# Patient Record
Sex: Male | Born: 1960 | Race: Black or African American | Hispanic: No | Marital: Married | Smoking: Never smoker
Health system: Southern US, Community
[De-identification: ages and names within clinical notes are randomized; demographics above are authoritative.]

## PROBLEM LIST (undated history)

## (undated) DIAGNOSIS — F419 Anxiety disorder, unspecified: Secondary | ICD-10-CM

## (undated) DIAGNOSIS — Z95810 Presence of automatic (implantable) cardiac defibrillator: Secondary | ICD-10-CM

## (undated) DIAGNOSIS — I2510 Atherosclerotic heart disease of native coronary artery without angina pectoris: Secondary | ICD-10-CM

## (undated) DIAGNOSIS — I509 Heart failure, unspecified: Secondary | ICD-10-CM

## (undated) DIAGNOSIS — G4730 Sleep apnea, unspecified: Secondary | ICD-10-CM

## (undated) DIAGNOSIS — E039 Hypothyroidism, unspecified: Secondary | ICD-10-CM

## (undated) HISTORY — PX: CARDIAC CATHETERIZATION: SHX172

## (undated) HISTORY — PX: GASTRIC BYPASS: SHX52

---

## 2020-05-07 ENCOUNTER — Emergency Department (HOSPITAL_COMMUNITY): Payer: 59

## 2020-05-07 ENCOUNTER — Inpatient Hospital Stay (HOSPITAL_COMMUNITY)
Admission: EM | Admit: 2020-05-07 | Discharge: 2020-05-11 | DRG: 193 | Disposition: A | Discharge status: Home / Self Care | Payer: 59 | Attending: Family Medicine | Admitting: Family Medicine

## 2020-05-07 ENCOUNTER — Other Ambulatory Visit: Payer: Self-pay

## 2020-05-07 ENCOUNTER — Encounter (HOSPITAL_COMMUNITY): Payer: Self-pay | Admitting: Emergency Medicine

## 2020-05-07 DIAGNOSIS — I5043 Acute on chronic combined systolic (congestive) and diastolic (congestive) heart failure: Secondary | ICD-10-CM | POA: Diagnosis present

## 2020-05-07 DIAGNOSIS — Z77120 Contact with and (suspected) exposure to mold (toxic): Secondary | ICD-10-CM | POA: Diagnosis not present

## 2020-05-07 DIAGNOSIS — J189 Pneumonia, unspecified organism: Secondary | ICD-10-CM | POA: Diagnosis present

## 2020-05-07 DIAGNOSIS — I5031 Acute diastolic (congestive) heart failure: Secondary | ICD-10-CM | POA: Diagnosis not present

## 2020-05-07 DIAGNOSIS — Z9884 Bariatric surgery status: Secondary | ICD-10-CM | POA: Diagnosis not present

## 2020-05-07 DIAGNOSIS — I5042 Chronic combined systolic (congestive) and diastolic (congestive) heart failure: Secondary | ICD-10-CM | POA: Diagnosis not present

## 2020-05-07 DIAGNOSIS — Z9989 Dependence on other enabling machines and devices: Secondary | ICD-10-CM

## 2020-05-07 DIAGNOSIS — E785 Hyperlipidemia, unspecified: Secondary | ICD-10-CM | POA: Diagnosis present

## 2020-05-07 DIAGNOSIS — I272 Pulmonary hypertension, unspecified: Secondary | ICD-10-CM | POA: Diagnosis present

## 2020-05-07 DIAGNOSIS — I493 Ventricular premature depolarization: Secondary | ICD-10-CM | POA: Diagnosis present

## 2020-05-07 DIAGNOSIS — Z6837 Body mass index (BMI) 37.0-37.9, adult: Secondary | ICD-10-CM

## 2020-05-07 DIAGNOSIS — Z9861 Coronary angioplasty status: Secondary | ICD-10-CM

## 2020-05-07 DIAGNOSIS — J180 Bronchopneumonia, unspecified organism: Secondary | ICD-10-CM | POA: Diagnosis present

## 2020-05-07 DIAGNOSIS — Z20822 Contact with and (suspected) exposure to covid-19: Secondary | ICD-10-CM | POA: Diagnosis present

## 2020-05-07 DIAGNOSIS — E039 Hypothyroidism, unspecified: Secondary | ICD-10-CM | POA: Diagnosis present

## 2020-05-07 DIAGNOSIS — G894 Chronic pain syndrome: Secondary | ICD-10-CM | POA: Diagnosis present

## 2020-05-07 DIAGNOSIS — R071 Chest pain on breathing: Secondary | ICD-10-CM

## 2020-05-07 DIAGNOSIS — I340 Nonrheumatic mitral (valve) insufficiency: Secondary | ICD-10-CM | POA: Diagnosis not present

## 2020-05-07 DIAGNOSIS — F419 Anxiety disorder, unspecified: Secondary | ICD-10-CM | POA: Diagnosis present

## 2020-05-07 DIAGNOSIS — R9389 Abnormal findings on diagnostic imaging of other specified body structures: Secondary | ICD-10-CM | POA: Diagnosis not present

## 2020-05-07 DIAGNOSIS — G4733 Obstructive sleep apnea (adult) (pediatric): Secondary | ICD-10-CM

## 2020-05-07 DIAGNOSIS — R0600 Dyspnea, unspecified: Secondary | ICD-10-CM

## 2020-05-07 DIAGNOSIS — I2510 Atherosclerotic heart disease of native coronary artery without angina pectoris: Secondary | ICD-10-CM | POA: Diagnosis present

## 2020-05-07 DIAGNOSIS — I255 Ischemic cardiomyopathy: Secondary | ICD-10-CM | POA: Diagnosis present

## 2020-05-07 DIAGNOSIS — R008 Other abnormalities of heart beat: Secondary | ICD-10-CM | POA: Diagnosis present

## 2020-05-07 DIAGNOSIS — Z888 Allergy status to other drugs, medicaments and biological substances status: Secondary | ICD-10-CM | POA: Diagnosis not present

## 2020-05-07 DIAGNOSIS — Z95810 Presence of automatic (implantable) cardiac defibrillator: Secondary | ICD-10-CM | POA: Diagnosis not present

## 2020-05-07 DIAGNOSIS — R079 Chest pain, unspecified: Secondary | ICD-10-CM | POA: Diagnosis present

## 2020-05-07 DIAGNOSIS — R0602 Shortness of breath: Secondary | ICD-10-CM | POA: Diagnosis present

## 2020-05-07 DIAGNOSIS — I5020 Unspecified systolic (congestive) heart failure: Secondary | ICD-10-CM | POA: Diagnosis not present

## 2020-05-07 DIAGNOSIS — E669 Obesity, unspecified: Secondary | ICD-10-CM | POA: Diagnosis present

## 2020-05-07 DIAGNOSIS — I110 Hypertensive heart disease with heart failure: Secondary | ICD-10-CM | POA: Diagnosis present

## 2020-05-07 HISTORY — DX: Sleep apnea, unspecified: G47.30

## 2020-05-07 HISTORY — DX: Hypothyroidism, unspecified: E03.9

## 2020-05-07 HISTORY — DX: Atherosclerotic heart disease of native coronary artery without angina pectoris: I25.10

## 2020-05-07 HISTORY — DX: Anxiety disorder, unspecified: F41.9

## 2020-05-07 HISTORY — DX: Heart failure, unspecified: I50.9

## 2020-05-07 HISTORY — DX: Presence of automatic (implantable) cardiac defibrillator: Z95.810

## 2020-05-07 LAB — CBC WITH DIFFERENTIAL/PLATELET
Abs Immature Granulocytes: 0.06 10*3/uL (ref 0.00–0.07)
Basophils Absolute: 0.1 10*3/uL (ref 0.0–0.1)
Basophils Relative: 1 %
Eosinophils Absolute: 0 10*3/uL (ref 0.0–0.5)
Eosinophils Relative: 0 %
HCT: 36.9 % — ABNORMAL LOW (ref 39.0–52.0)
Hemoglobin: 12.3 g/dL — ABNORMAL LOW (ref 13.0–17.0)
Immature Granulocytes: 1 %
Lymphocytes Relative: 7 %
Lymphs Abs: 0.7 10*3/uL (ref 0.7–4.0)
MCH: 30.4 pg (ref 26.0–34.0)
MCHC: 33.3 g/dL (ref 30.0–36.0)
MCV: 91.3 fL (ref 80.0–100.0)
Monocytes Absolute: 0.9 10*3/uL (ref 0.1–1.0)
Monocytes Relative: 8 %
Neutro Abs: 9.2 10*3/uL — ABNORMAL HIGH (ref 1.7–7.7)
Neutrophils Relative %: 83 %
Platelets: 203 10*3/uL (ref 150–400)
RBC: 4.04 MIL/uL — ABNORMAL LOW (ref 4.22–5.81)
RDW: 14.8 % (ref 11.5–15.5)
WBC: 11 10*3/uL — ABNORMAL HIGH (ref 4.0–10.5)
nRBC: 0 % (ref 0.0–0.2)

## 2020-05-07 LAB — TROPONIN I (HIGH SENSITIVITY)
Troponin I (High Sensitivity): 6 ng/L (ref <18)
Troponin I (High Sensitivity): 6 ng/L (ref <18)

## 2020-05-07 LAB — BRAIN NATRIURETIC PEPTIDE: B Natriuretic Peptide: 1095.8 pg/mL — ABNORMAL HIGH (ref 0.0–100.0)

## 2020-05-07 LAB — I-STAT CHEM 8, ED
BUN: 13 mg/dL (ref 6–20)
Calcium, Ion: 1.18 mmol/L (ref 1.15–1.40)
Chloride: 103 mmol/L (ref 98–111)
Creatinine, Ser: 0.8 mg/dL (ref 0.61–1.24)
Glucose, Bld: 105 mg/dL — ABNORMAL HIGH (ref 70–99)
HCT: 37 % — ABNORMAL LOW (ref 39.0–52.0)
Hemoglobin: 12.6 g/dL — ABNORMAL LOW (ref 13.0–17.0)
Potassium: 3.8 mmol/L (ref 3.5–5.1)
Sodium: 137 mmol/L (ref 135–145)
TCO2: 21 mmol/L — ABNORMAL LOW (ref 22–32)

## 2020-05-07 LAB — RESP PANEL BY RT-PCR (FLU A&B, COVID) ARPGX2
Influenza A by PCR: NEGATIVE
Influenza B by PCR: NEGATIVE
SARS Coronavirus 2 by RT PCR: NEGATIVE

## 2020-05-07 LAB — STREP PNEUMONIAE URINARY ANTIGEN: Strep Pneumo Urinary Antigen: NEGATIVE

## 2020-05-07 MED ORDER — GABAPENTIN 300 MG PO CAPS
600.0000 mg | ORAL_CAPSULE | Freq: Three times a day (TID) | ORAL | Status: DC
  Administered 2020-05-07 – 2020-05-11 (×11): 600 mg via ORAL
  Filled 2020-05-07 (×12): qty 2

## 2020-05-07 MED ORDER — SENNOSIDES-DOCUSATE SODIUM 8.6-50 MG PO TABS: 1.0000 | ORAL_TABLET | Freq: Every evening | ORAL | Status: DC | PRN

## 2020-05-07 MED ORDER — SPIRONOLACTONE 25 MG PO TABS
25.0000 mg | ORAL_TABLET | Freq: Every day | ORAL | Status: DC
  Administered 2020-05-07 – 2020-05-11 (×5): 25 mg via ORAL
  Filled 2020-05-07 (×6): qty 1

## 2020-05-07 MED ORDER — SODIUM CHLORIDE 0.9 % IV SOLN
500.0000 mg | INTRAVENOUS | Status: AC
  Administered 2020-05-08 – 2020-05-11 (×4): 500 mg via INTRAVENOUS
  Filled 2020-05-07 (×4): qty 500

## 2020-05-07 MED ORDER — ONDANSETRON HCL 4 MG/2ML IJ SOLN: 4.0000 mg | Freq: Four times a day (QID) | INTRAMUSCULAR | Status: DC | PRN

## 2020-05-07 MED ORDER — ENOXAPARIN SODIUM 80 MG/0.8ML ~~LOC~~ SOLN
0.5000 mg/kg | Freq: Every day | SUBCUTANEOUS | Status: DC
  Administered 2020-05-07 – 2020-05-11 (×5): 62.5 mg via SUBCUTANEOUS
  Filled 2020-05-07 (×5): qty 0.8

## 2020-05-07 MED ORDER — ACETAMINOPHEN 650 MG RE SUPP: 650.0000 mg | Freq: Four times a day (QID) | RECTAL | Status: DC | PRN

## 2020-05-07 MED ORDER — SODIUM CHLORIDE 0.9 % IV SOLN
500.0000 mg | Freq: Once | INTRAVENOUS | Status: AC
  Administered 2020-05-07: 500 mg via INTRAVENOUS
  Filled 2020-05-07: qty 500

## 2020-05-07 MED ORDER — SODIUM CHLORIDE 0.9 % IV SOLN: 2.0000 g | INTRAVENOUS | Status: DC

## 2020-05-07 MED ORDER — IOHEXOL 350 MG/ML SOLN
80.0000 mL | Freq: Once | INTRAVENOUS | Status: AC | PRN
  Administered 2020-05-07: 80 mL via INTRAVENOUS

## 2020-05-07 MED ORDER — ONDANSETRON HCL 4 MG PO TABS: 4.0000 mg | ORAL_TABLET | Freq: Four times a day (QID) | ORAL | Status: DC | PRN

## 2020-05-07 MED ORDER — ACETAMINOPHEN 325 MG PO TABS: 650.0000 mg | ORAL_TABLET | Freq: Four times a day (QID) | ORAL | Status: DC | PRN

## 2020-05-07 MED ORDER — SACUBITRIL-VALSARTAN 49-51 MG PO TABS
1.0000 | ORAL_TABLET | Freq: Two times a day (BID) | ORAL | Status: DC
  Administered 2020-05-07 – 2020-05-11 (×9): 1 via ORAL
  Filled 2020-05-07 (×10): qty 1

## 2020-05-07 MED ORDER — ALBUTEROL SULFATE HFA 108 (90 BASE) MCG/ACT IN AERS: 2.0000 | INHALATION_SPRAY | RESPIRATORY_TRACT | Status: DC | PRN

## 2020-05-07 MED ORDER — METOPROLOL SUCCINATE ER 100 MG PO TB24
100.0000 mg | ORAL_TABLET | Freq: Every day | ORAL | Status: DC
  Administered 2020-05-08 – 2020-05-11 (×3): 100 mg via ORAL
  Filled 2020-05-07 (×5): qty 1

## 2020-05-07 MED ORDER — KETOROLAC TROMETHAMINE 30 MG/ML IJ SOLN
30.0000 mg | Freq: Once | INTRAMUSCULAR | Status: AC
  Administered 2020-05-07: 30 mg via INTRAVENOUS
  Filled 2020-05-07: qty 1

## 2020-05-07 MED ORDER — SODIUM CHLORIDE 0.9 % IV SOLN
1.0000 g | Freq: Once | INTRAVENOUS | Status: AC
  Administered 2020-05-07: 1 g via INTRAVENOUS
  Filled 2020-05-07: qty 10

## 2020-05-07 MED ORDER — ENOXAPARIN SODIUM 40 MG/0.4ML ~~LOC~~ SOLN: 40.0000 mg | Freq: Every day | SUBCUTANEOUS | Status: DC

## 2020-05-07 MED ORDER — ATORVASTATIN CALCIUM 80 MG PO TABS
80.0000 mg | ORAL_TABLET | Freq: Every day | ORAL | Status: DC
  Administered 2020-05-07 – 2020-05-11 (×5): 80 mg via ORAL
  Filled 2020-05-07 (×5): qty 1

## 2020-05-07 MED ORDER — ASPIRIN EC 81 MG PO TBEC
81.0000 mg | DELAYED_RELEASE_TABLET | Freq: Every day | ORAL | Status: DC
  Administered 2020-05-07 – 2020-05-11 (×5): 81 mg via ORAL
  Filled 2020-05-07 (×5): qty 1

## 2020-05-07 MED ORDER — FLUOXETINE HCL 20 MG PO CAPS
20.0000 mg | ORAL_CAPSULE | Freq: Every day | ORAL | Status: DC
  Administered 2020-05-07 – 2020-05-11 (×5): 20 mg via ORAL
  Filled 2020-05-07 (×5): qty 1

## 2020-05-07 MED ORDER — OXYCODONE HCL 5 MG PO TABS
10.0000 mg | ORAL_TABLET | Freq: Two times a day (BID) | ORAL | Status: DC | PRN
  Administered 2020-05-07 – 2020-05-10 (×3): 10 mg via ORAL
  Filled 2020-05-07 (×3): qty 2

## 2020-05-07 MED ORDER — GUAIFENESIN 100 MG/5ML PO SOLN: 5.0000 mL | ORAL | Status: DC | PRN

## 2020-05-07 MED ORDER — ORAL CARE MOUTH RINSE
15.0000 mL | Freq: Two times a day (BID) | OROMUCOSAL | Status: DC
  Administered 2020-05-07 – 2020-05-09 (×6): 15 mL via OROMUCOSAL

## 2020-05-07 MED ORDER — SODIUM CHLORIDE 0.9 % IV SOLN
2.0000 g | INTRAVENOUS | Status: AC
  Administered 2020-05-07 – 2020-05-10 (×4): 2 g via INTRAVENOUS
  Filled 2020-05-07: qty 20
  Filled 2020-05-07 (×3): qty 2

## 2020-05-07 MED ORDER — PANTOPRAZOLE SODIUM 40 MG PO TBEC
40.0000 mg | DELAYED_RELEASE_TABLET | Freq: Every day | ORAL | Status: DC
  Administered 2020-05-07 – 2020-05-11 (×5): 40 mg via ORAL
  Filled 2020-05-07 (×5): qty 1

## 2020-05-07 MED ORDER — CHLORHEXIDINE GLUCONATE 0.12 % MT SOLN
15.0000 mL | Freq: Two times a day (BID) | OROMUCOSAL | Status: DC
  Administered 2020-05-07 – 2020-05-11 (×8): 15 mL via OROMUCOSAL
  Filled 2020-05-07 (×9): qty 15

## 2020-05-07 MED ORDER — LEVOTHYROXINE SODIUM 100 MCG PO TABS
200.0000 ug | ORAL_TABLET | Freq: Every day | ORAL | Status: DC
  Administered 2020-05-07 – 2020-05-11 (×5): 200 ug via ORAL
  Filled 2020-05-07 (×5): qty 2

## 2020-05-07 NOTE — H&P (Signed)
History and Physical    Todd Rivas [facility]:[id number] DOB: [birth year] DOA: 05/07/2020  PCP: Bufford Buttner   Patient coming from: Home   Chief Complaint: SOB, productive cough, chest pain, lightheadedness   HPI: Todd Rivas is a 59 y.o. male with medical history significant for chronic combined systolic and diastolic CHF, hypothyroidism, coronary artery disease, OSA on CPAP, hypothyroidism, anxiety, and chronic pain, now presenting to the emergency department for evaluation of shortness of breath, productive cough, chest pain, and lightheadedness.  Patient reports 2 to 3 days of shortness of breath, initially with exertion but worsening to the point where he is now dyspneic at rest.  This has been accompanied by a cough productive of thick sputum.  He then went on to develop pleuritic chest discomfort and lightheadedness last night which prompted his presentation.  He has not noticed any recent leg swelling, has not had subjective fevers or chills, and denies any diaphoresis or nausea.  Patient notes that he had gastric bypass surgery 6 months ago and has lost 100 pounds.  ED Course: Upon arrival to the ED, patient is found to be afebrile, saturating low 90s on room air, tachypneic, and with stable blood pressure.  EKG features sinus rhythm with nonspecific IVCD.  Chest x-ray concerning for pneumonia.  CTA chest negative for PE but notable for multifocal infiltrates.  Chemistry panel unremarkable.  CBC with mild leukocytosis.  High-sensitivity troponin normal x2.  BNP elevated to 1096.  COVID-19 is negative.  Blood cultures were collected in the ED and the patient was treated with Rocephin, azithromycin, and Toradol.  Review of Systems:  All other systems reviewed and apart from HPI, are negative.  Past Medical History:  Diagnosis Date  . AICD (automatic cardioverter/defibrillator) present   . Anxiety   . CHF (congestive heart failure) (HCC)   . Coronary artery disease   . Hypothyroidism    . Sleep apnea     Past Surgical History:  Procedure Laterality Date  . CARDIAC CATHETERIZATION    . GASTRIC BYPASS      Social History:   reports that he has never smoked. He has never used smokeless tobacco. He reports previous alcohol use. He reports previous drug use.  Allergies  Allergen Reactions  . Lisinopril     Family History  Problem Relation Age of Onset  . Arrhythmia Mother      Prior to Admission medications   Not on File    Physical Exam: Vitals:   05/07/20 0217 05/07/20 0230 05/07/20 0430 05/07/20 0515  BP:  105/68 99/69 99/71   Pulse:  87 75 74  Resp:  (!) 24 20 (!) 23  Temp: 98.1 F (36.7 C)     TempSrc: Oral     SpO2:  94% 93% 95%    Constitutional: NAD, calm  Eyes: PERTLA, lids and conjunctivae normal ENMT: Mucous membranes are moist. Posterior pharynx clear of any exudate or lesions.   Neck: normal, supple, no masses, no thyromegaly Respiratory: Scattered rhonchi, no wheezing. Mild tachypnea. No pallor or cyanosis.   Cardiovascular: S1 & S2 heard, regular rate and rhythm. No extremity edema.  Abdomen: No distension, no tenderness, soft. Bowel sounds active.  Musculoskeletal: no clubbing / cyanosis. No joint deformity upper and lower extremities.   Skin: no significant rashes, lesions, ulcers. Warm, dry, well-perfused. Neurologic: CN 2-12 grossly intact. Sensation intact. Moving all extremities.  Psychiatric: Alert and oriented to person, place, and situation. Very pleasant and cooperative.    Labs and Imaging on Admission:  I have personally reviewed following labs and imaging studies  CBC: Recent Labs  Lab 05/07/20 0211 05/07/20 0237  WBC 11.0*  --   NEUTROABS 9.2*  --   HGB 12.3* 12.6*  HCT 36.9* 37.0*  MCV 91.3  --   PLT 203  --    Basic Metabolic Panel: Recent Labs  Lab 05/07/20 0237  NA 137  K 3.8  CL 103  GLUCOSE 105*  BUN 13  CREATININE 0.80   GFR: CrCl cannot be calculated (Unknown ideal weight.). Liver Function  Tests: No results for input(s): AST, ALT, ALKPHOS, BILITOT, PROT, ALBUMIN in the last 168 hours. No results for input(s): LIPASE, AMYLASE in the last 168 hours. No results for input(s): AMMONIA in the last 168 hours. Coagulation Profile: No results for input(s): INR, PROTIME in the last 168 hours. Cardiac Enzymes: No results for input(s): CKTOTAL, CKMB, CKMBINDEX, TROPONINI in the last 168 hours. BNP (last 3 results) No results for input(s): PROBNP in the last 8760 hours. HbA1C: No results for input(s): HGBA1C in the last 72 hours. CBG: No results for input(s): GLUCAP in the last 168 hours. Lipid Profile: No results for input(s): CHOL, HDL, LDLCALC, TRIG, CHOLHDL, LDLDIRECT in the last 72 hours. Thyroid Function Tests: No results for input(s): TSH, T4TOTAL, FREET4, T3FREE, THYROIDAB in the last 72 hours. Anemia Panel: No results for input(s): VITAMINB12, FOLATE, FERRITIN, TIBC, IRON, RETICCTPCT in the last 72 hours. Urine analysis: No results found for: COLORURINE, APPEARANCEUR, LABSPEC, PHURINE, GLUCOSEU, HGBUR, BILIRUBINUR, KETONESUR, PROTEINUR, UROBILINOGEN, NITRITE, LEUKOCYTESUR Sepsis Labs: @LABRCNTIP (procalcitonin:4,lacticidven:4) ) Recent Results (from the past 240 hour(s))  Resp Panel by RT-PCR (Flu A&B, Covid) Nasopharyngeal Swab     Status: None   Collection Time: 05/07/20  2:41 AM   Specimen: Nasopharyngeal Swab; Nasopharyngeal(NP) swabs in vial transport medium  Result Value Ref Range Status   SARS Coronavirus 2 by RT PCR NEGATIVE NEGATIVE Final    Comment: (NOTE) SARS-CoV-2 target nucleic acids are NOT DETECTED.  The SARS-CoV-2 RNA is generally detectable in upper respiratory specimens during the acute phase of infection. The lowest concentration of SARS-CoV-2 viral copies this assay can detect is 138 copies/mL. A negative result does not preclude SARS-Cov-2 infection and should not be used as the sole basis for treatment or other patient management decisions. A  negative result may occur with  improper specimen collection/handling, submission of specimen other than nasopharyngeal swab, presence of viral mutation(s) within the areas targeted by this assay, and inadequate number of viral copies(<138 copies/mL). A negative result must be combined with clinical observations, patient history, and epidemiological information. The expected result is Negative.  Fact Sheet for Patients:  BloggerCourse.comhttps://www.fda.gov/media/152166/download  Fact Sheet for Healthcare Providers:  SeriousBroker.ithttps://www.fda.gov/media/152162/download  This test is no t yet approved or cleared by the [place] FDA and  has been authorized for detection and/or diagnosis of SARS-CoV-2 by FDA under an Emergency Use Authorization (EUA). This EUA will remain  in effect (meaning this test can be used) for the duration of the COVID-19 declaration under Section 564(b)(1) of the Act, 21 U.S.C.section 360bbb-3(b)(1), unless the authorization is terminated  or revoked sooner.       Influenza A by PCR NEGATIVE NEGATIVE Final   Influenza B by PCR NEGATIVE NEGATIVE Final    Comment: (NOTE) The Xpert Xpress SARS-CoV-2/FLU/RSV plus assay is intended as an aid in the diagnosis of influenza from Nasopharyngeal swab specimens and should not be used as a sole basis for treatment. Nasal washings and aspirates are unacceptable for  Xpert Xpress SARS-CoV-2/FLU/RSV testing.  Fact Sheet for Patients: BloggerCourse.com  Fact Sheet for Healthcare Providers: SeriousBroker.it  This test is not yet approved or cleared by the [place] FDA and has been authorized for detection and/or diagnosis of SARS-CoV-2 by FDA under an Emergency Use Authorization (EUA). This EUA will remain in effect (meaning this test can be used) for the duration of the COVID-19 declaration under Section 564(b)(1) of the Act, 21 U.S.C. section 360bbb-3(b)(1), unless the authorization  is terminated or revoked.  Performed at [facility], [address] [place], [place] [address]      Radiological Exams on Admission: CT Angio Chest PE W and/or Wo Contrast  Result Date: 05/07/2020 CLINICAL DATA:  Chest pain and shortness of breath. EXAM: CT ANGIOGRAPHY CHEST WITH CONTRAST TECHNIQUE: Multidetector CT imaging of the chest was performed using the standard protocol during bolus administration of intravenous contrast. Multiplanar CT image reconstructions and MIPs were obtained to evaluate the vascular anatomy. CONTRAST:  72mL OMNIPAQUE IOHEXOL 350 MG/ML SOLN COMPARISON:  Chest radiograph 05/07/2020 FINDINGS: Cardiovascular: Examination is limited by streak artifact and motion. There is moderately good opacification of the central and proximal segmental pulmonary arteries. No focal filling defects demonstrated. No evidence of significant pulmonary embolus. Cardiac enlargement. No pericardial effusions. Coronary artery and aortic calcification. No aortic aneurysm. Cardiac pacemaker. Mediastinum/Nodes: Esophagus is decompressed. No significant lymphadenopathy in the chest. Lungs/Pleura: Motion artifact limits examination. There are small bilateral pleural effusions. Nodular perihilar infiltrates in both lungs, greater on the right. Changes likely represent multifocal bronchopneumonia. Edema would be less likely to have this appearance. No pneumothorax. Upper Abdomen: No acute abnormalities demonstrated in the visualized upper abdomen. Postoperative changes consistent with gastric bypass. Cyst in the upper pole of the left kidney. Musculoskeletal: Degenerative changes in the spine. Review of the MIP images confirms the above findings. IMPRESSION: 1. No evidence of significant pulmonary embolus. 2. Cardiac enlargement. 3. Nodular perihilar infiltrates in both lungs, greater on the right, likely representing multifocal bronchopneumonia. Edema would be less likely to have this appearance. 4.  Small bilateral pleural effusions. 5. Aortic atherosclerosis. 6. Postoperative changes consistent with gastric bypass. Aortic Atherosclerosis (ICD10-I70.0). Electronically Signed   By: Burman Nieves M.D.   On: 05/07/2020 03:37   DG Chest Portable 1 View  Result Date: 05/07/2020 CLINICAL DATA:  Chest pain and abnormal heart rate. EXAM: PORTABLE CHEST 1 VIEW COMPARISON:  None. FINDINGS: Cardiac pacemaker. Cardiac enlargement. Right perihilar and middle lobe infiltrate suggesting pneumonia. No pleural effusions. No pneumothorax. Mediastinal contours appear intact. IMPRESSION: Cardiac enlargement. Right perihilar and middle lobe infiltrate suggesting pneumonia. Electronically Signed   By: Burman Nieves M.D.   On: 05/07/2020 02:49    EKG: Independently reviewed. Sinus rhythm, non-specific IVCD.   Assessment/Plan   1. Pneumonia  - Presents with 2-3 days SOB and productive cough, and then pleuritic chest pain and lightheadedness overnight and is found to have pneumonia  - Blood cultures collected in ED and Rocephin and azithromycin started  - Check strep pneumo and legionella antigens, continue Rocephin and azithromycin, follow cultures and clinical course    2. CAD  - Complains of pleuritic pain, likely d/t pneumonia given negative HS troponin x2 and no PE on CTA  - Continue ASA, statin, beta-blocker    3. Chronic combined systolic & diastolic CHF  - Appears compensated  - EF was 35-40% in September 2020; has AICD in place  - Continue diuretic, Entresto, beta-blocker - Monitor weight and I/Os  4. Anxiety  - Continue Prozac    5. Chronic pain  - Continue home regimen with gabapentin and as-needed oxycodone    6. Hypothyroidism  - Continue Synthroid   7. OSA  - Continue CPAP qHS     DVT prophylaxis: Lovenox  Code Status: Full  Level of Care: Level of care: Telemetry Cardiac Family Communication: Wife updated at bedside Disposition Plan:  Patient is from: Home  Anticipated  d/c is to: Home  Anticipated d/c date is: 05/10/20 Patient currently: pending improvement in respiratory status  Consults called: none  Admission status: Inpatient     Briscoe Deutscher, MD [facility]  05/07/2020, 5:40 AM

## 2020-05-07 NOTE — ED Provider Notes (Signed)
[facility] EMERGENCY DEPARTMENT Provider Note   CSN: [id number] Arrival date & time: 05/07/20  0203     History Chief Complaint  Patient presents with  . Chest Pain    CP from friends home    Wildon Cuevas is a 60 y.o. male.  The history is provided by the patient.  Chest Pain Pain location:  Substernal area Pain quality: dull   Pain radiates to:  Does not radiate Pain severity:  Severe Onset quality:  Sudden Timing:  Constant Progression:  Unchanged Chronicity:  Recurrent Context comment:  Walking upstairs at a friend's home  Relieved by:  Nothing Worsened by:  Nothing Ineffective treatments:  None tried Associated symptoms: shortness of breath   Associated symptoms: no abdominal pain, no cough, no fever, no headache and no nausea   Risk factors: male sex   Patient with CAD presents for CP and SOB while ambulating upstairs.  No f/c/r.  No vomiting.  No leg pain or swelling no long travel.      History reviewed. No pertinent past medical history.  Patient Active Problem List   Diagnosis Date Noted  . Multifocal pneumonia 05/07/2020  . Chest pain 05/07/2020  . Chronic combined systolic and diastolic CHF (congestive heart failure) (HCC) 05/07/2020  . CAD (coronary artery disease) 05/07/2020  . Anxiety 05/07/2020  . Hypothyroidism 05/07/2020  . OSA on CPAP 05/07/2020    History reviewed. No pertinent surgical history.     Family History  Problem Relation Age of Onset  . Arrhythmia Mother     Social History   Tobacco Use  . Smoking status: Never Smoker  . Smokeless tobacco: Never Used  Substance Use Topics  . Alcohol use: Not Currently  . Drug use: Not Currently    Home Medications Prior to Admission medications   Not on File    Allergies    Lisinopril  Review of Systems   Review of Systems  Constitutional: Negative for fever.  HENT: Negative for congestion.   Eyes: Negative for visual disturbance.  Respiratory: Positive  for shortness of breath. Negative for cough.   Cardiovascular: Positive for chest pain.  Gastrointestinal: Negative for abdominal pain and nausea.  Genitourinary: Negative for difficulty urinating.  Musculoskeletal: Negative for arthralgias.  Skin: Negative for rash.  Neurological: Negative for headaches.  Psychiatric/Behavioral: Negative for agitation.  All other systems reviewed and are negative.   Physical Exam Updated Vital Signs BP 99/69   Pulse 75   Temp 98.1 F (36.7 C) (Oral)   Resp 20   SpO2 93%   Physical Exam Vitals and nursing note reviewed.  Constitutional:      General: He is not in acute distress.    Appearance: Normal appearance.  HENT:     Head: Normocephalic and atraumatic.     Nose: Nose normal.  Eyes:     Conjunctiva/sclera: Conjunctivae normal.     Pupils: Pupils are equal, round, and reactive to light.  Cardiovascular:     Rate and Rhythm: Normal rate and regular rhythm.     Pulses: Normal pulses.     Heart sounds: Normal heart sounds.  Pulmonary:     Effort: Pulmonary effort is normal.     Breath sounds: Decreased air movement present.  Abdominal:     General: Abdomen is flat. Bowel sounds are normal.     Palpations: Abdomen is soft.     Tenderness: There is no abdominal tenderness. There is no guarding.  Musculoskeletal:  General: Normal range of motion.     Cervical back: Normal range of motion and neck supple.  Skin:    General: Skin is warm and dry.     Capillary Refill: Capillary refill takes less than 2 seconds.  Neurological:     General: No focal deficit present.     Mental Status: He is alert and oriented to person, place, and time.     Deep Tendon Reflexes: Reflexes normal.  Psychiatric:        Mood and Affect: Mood normal.        Behavior: Behavior normal.     ED Results / Procedures / Treatments   Labs (all labs ordered are listed, but only abnormal results are displayed) Results for orders placed or performed  during the hospital encounter of 05/07/20  Resp Panel by RT-PCR (Flu A&B, Covid) Nasopharyngeal Swab   Specimen: Nasopharyngeal Swab; Nasopharyngeal(NP) swabs in vial transport medium  Result Value Ref Range   SARS Coronavirus 2 by RT PCR NEGATIVE NEGATIVE   Influenza A by PCR NEGATIVE NEGATIVE   Influenza B by PCR NEGATIVE NEGATIVE  CBC with Differential/Platelet  Result Value Ref Range   WBC 11.0 (H) 4.0 - 10.5 K/uL   RBC 4.04 (L) 4.22 - 5.81 MIL/uL   Hemoglobin 12.3 (L) 13.0 - 17.0 g/dL   HCT 17.6 (L) 16.0 - 73.7 %   MCV 91.3 80.0 - 100.0 fL   MCH 30.4 26.0 - 34.0 pg   MCHC 33.3 30.0 - 36.0 g/dL   RDW 10.6 26.9 - 48.5 %   Platelets 203 150 - 400 K/uL   nRBC 0.0 0.0 - 0.2 %   Neutrophils Relative % 83 %   Neutro Abs 9.2 (H) 1.7 - 7.7 K/uL   Lymphocytes Relative 7 %   Lymphs Abs 0.7 0.7 - 4.0 K/uL   Monocytes Relative 8 %   Monocytes Absolute 0.9 0.1 - 1.0 K/uL   Eosinophils Relative 0 %   Eosinophils Absolute 0.0 0.0 - 0.5 K/uL   Basophils Relative 1 %   Basophils Absolute 0.1 0.0 - 0.1 K/uL   RBC Morphology MORPHOLOGY UNREMARKABLE    Immature Granulocytes 1 %   Abs Immature Granulocytes 0.06 0.00 - 0.07 K/uL  Brain natriuretic peptide  Result Value Ref Range   B Natriuretic Peptide 1,095.8 (H) 0.0 - 100.0 pg/mL  I-stat chem 8, ED (not at [facility] or [facility])  Result Value Ref Range   Sodium 137 135 - 145 mmol/L   Potassium 3.8 3.5 - 5.1 mmol/L   Chloride 103 98 - 111 mmol/L   BUN 13 6 - 20 mg/dL   Creatinine, Ser 4.62 0.61 - 1.24 mg/dL   Glucose, Bld 703 (H) 70 - 99 mg/dL   Calcium, Ion 5.00 9.38 - 1.40 mmol/L   TCO2 21 (L) 22 - 32 mmol/L   Hemoglobin 12.6 (L) 13.0 - 17.0 g/dL   HCT 18.2 (L) 99.3 - 71.6 %  Troponin I (High Sensitivity)  Result Value Ref Range   Troponin I (High Sensitivity) 6 <18 ng/L  Troponin I (High Sensitivity)  Result Value Ref Range   Troponin I (High Sensitivity) 6 <18 ng/L   CT Angio Chest PE W and/or Wo Contrast  Result Date:  05/07/2020 CLINICAL DATA:  Chest pain and shortness of breath. EXAM: CT ANGIOGRAPHY CHEST WITH CONTRAST TECHNIQUE: Multidetector CT imaging of the chest was performed using the standard protocol during bolus administration of intravenous contrast. Multiplanar CT image reconstructions and MIPs were  obtained to evaluate the vascular anatomy. CONTRAST:  13mL OMNIPAQUE IOHEXOL 350 MG/ML SOLN COMPARISON:  Chest radiograph 05/07/2020 FINDINGS: Cardiovascular: Examination is limited by streak artifact and motion. There is moderately good opacification of the central and proximal segmental pulmonary arteries. No focal filling defects demonstrated. No evidence of significant pulmonary embolus. Cardiac enlargement. No pericardial effusions. Coronary artery and aortic calcification. No aortic aneurysm. Cardiac pacemaker. Mediastinum/Nodes: Esophagus is decompressed. No significant lymphadenopathy in the chest. Lungs/Pleura: Motion artifact limits examination. There are small bilateral pleural effusions. Nodular perihilar infiltrates in both lungs, greater on the right. Changes likely represent multifocal bronchopneumonia. Edema would be less likely to have this appearance. No pneumothorax. Upper Abdomen: No acute abnormalities demonstrated in the visualized upper abdomen. Postoperative changes consistent with gastric bypass. Cyst in the upper pole of the left kidney. Musculoskeletal: Degenerative changes in the spine. Review of the MIP images confirms the above findings. IMPRESSION: 1. No evidence of significant pulmonary embolus. 2. Cardiac enlargement. 3. Nodular perihilar infiltrates in both lungs, greater on the right, likely representing multifocal bronchopneumonia. Edema would be less likely to have this appearance. 4. Small bilateral pleural effusions. 5. Aortic atherosclerosis. 6. Postoperative changes consistent with gastric bypass. Aortic Atherosclerosis (ICD10-I70.0). Electronically Signed   By: Burman Nieves  M.D.   On: 05/07/2020 03:37   DG Chest Portable 1 View  Result Date: 05/07/2020 CLINICAL DATA:  Chest pain and abnormal heart rate. EXAM: PORTABLE CHEST 1 VIEW COMPARISON:  None. FINDINGS: Cardiac pacemaker. Cardiac enlargement. Right perihilar and middle lobe infiltrate suggesting pneumonia. No pleural effusions. No pneumothorax. Mediastinal contours appear intact. IMPRESSION: Cardiac enlargement. Right perihilar and middle lobe infiltrate suggesting pneumonia. Electronically Signed   By: Burman Nieves M.D.   On: 05/07/2020 02:49    EKG EKG Interpretation  Date/Time:  Sunday Dalaysia Harms 24 2022 02:14:21 EDT Ventricular Rate:  90 PR Interval:  188 QRS Duration: 115 QT Interval:  392 QTC Calculation: 480 R Axis:   3 Text Interpretation: Sinus rhythm Nonspecific intraventricular conduction delay Low voltage, extremity leads No old tracing to compare Confirmed by Dione Booze ([id number]) on 05/07/2020 5:17:00 AM   Radiology CT Angio Chest PE W and/or Wo Contrast  Result Date: 05/07/2020 CLINICAL DATA:  Chest pain and shortness of breath. EXAM: CT ANGIOGRAPHY CHEST WITH CONTRAST TECHNIQUE: Multidetector CT imaging of the chest was performed using the standard protocol during bolus administration of intravenous contrast. Multiplanar CT image reconstructions and MIPs were obtained to evaluate the vascular anatomy. CONTRAST:  71mL OMNIPAQUE IOHEXOL 350 MG/ML SOLN COMPARISON:  Chest radiograph 05/07/2020 FINDINGS: Cardiovascular: Examination is limited by streak artifact and motion. There is moderately good opacification of the central and proximal segmental pulmonary arteries. No focal filling defects demonstrated. No evidence of significant pulmonary embolus. Cardiac enlargement. No pericardial effusions. Coronary artery and aortic calcification. No aortic aneurysm. Cardiac pacemaker. Mediastinum/Nodes: Esophagus is decompressed. No significant lymphadenopathy in the chest. Lungs/Pleura: Motion artifact  limits examination. There are small bilateral pleural effusions. Nodular perihilar infiltrates in both lungs, greater on the right. Changes likely represent multifocal bronchopneumonia. Edema would be less likely to have this appearance. No pneumothorax. Upper Abdomen: No acute abnormalities demonstrated in the visualized upper abdomen. Postoperative changes consistent with gastric bypass. Cyst in the upper pole of the left kidney. Musculoskeletal: Degenerative changes in the spine. Review of the MIP images confirms the above findings. IMPRESSION: 1. No evidence of significant pulmonary embolus. 2. Cardiac enlargement. 3. Nodular perihilar infiltrates in both lungs, greater on the right, likely representing  multifocal bronchopneumonia. Edema would be less likely to have this appearance. 4. Small bilateral pleural effusions. 5. Aortic atherosclerosis. 6. Postoperative changes consistent with gastric bypass. Aortic Atherosclerosis (ICD10-I70.0). Electronically Signed   By: Burman NievesWilliam  Stevens M.D.   On: 05/07/2020 03:37   DG Chest Portable 1 View  Result Date: 05/07/2020 CLINICAL DATA:  Chest pain and abnormal heart rate. EXAM: PORTABLE CHEST 1 VIEW COMPARISON:  None. FINDINGS: Cardiac pacemaker. Cardiac enlargement. Right perihilar and middle lobe infiltrate suggesting pneumonia. No pleural effusions. No pneumothorax. Mediastinal contours appear intact. IMPRESSION: Cardiac enlargement. Right perihilar and middle lobe infiltrate suggesting pneumonia. Electronically Signed   By: Burman NievesWilliam  Stevens M.D.   On: 05/07/2020 02:49    Procedures Procedures   Medications Ordered in ED Medications  cefTRIAXone (ROCEPHIN) 1 g in sodium chloride 0.9 % 100 mL IVPB (1 g Intravenous New Bag/Given 05/07/20 0507)  azithromycin (ZITHROMAX) 500 mg in sodium chloride 0.9 % 250 mL IVPB (has no administration in time range)  ketorolac (TORADOL) 30 MG/ML injection 30 mg (30 mg Intravenous Given 05/07/20 0314)  iohexol (OMNIPAQUE)  350 MG/ML injection 80 mL (80 mLs Intravenous Contrast Given 05/07/20 0316)    ED Course  I have reviewed the triage vital signs and the nursing notes.  Pertinent labs & imaging results that were available during my care of the patient were reviewed by me and considered in my medical decision making (see chart for details).    Admit to medicine for multifocal pneumonia  Final Clinical Impression(s) / ED Diagnoses Final diagnoses:  None    Admit    Morissa Obeirne, MD 05/07/20 [id number]

## 2020-05-07 NOTE — ED Triage Notes (Signed)
Patient coming from friends home, patient is on vacation. In the shower, patient felt "my heart rate is off." patient then walked upstairs and began having chest pain. Patient arrives via EMS very sleepy at this time. Per EMS patient has extensive cardiac history. Patient came with bag of home medications.

## 2020-05-07 NOTE — Progress Notes (Signed)
Todd Rivas is a 60 y.o. male with medical history significant for chronic combined systolic and diastolic CHF, hypothyroidism, coronary artery disease, OSA on CPAP, hypothyroidism, anxiety, and chronic pain, now presenting to the emergency department for evaluation of shortness of breath, productive cough, chest pain, and lightheadedness.  Patient reports 2 to 3 days of shortness of breath, initially with exertion but worsening to the point where he is now dyspneic at rest.  This has been accompanied by a cough productive of thick sputum.  He then went on to develop pleuritic chest discomfort and lightheadedness last night which prompted his presentation.  He has not noticed any recent leg swelling, has not had subjective fevers or chills, and denies any diaphoresis or nausea.  Patient notes that he had gastric bypass surgery 6 months ago and has lost 100 pounds.  05/07/20: Patient was seen and examined at his bedside.  He reports persistent, productive cough.  Sputum culture has been ordered and is pending.  Denies any use of alcohol.  States he has OSA and uses a CPAP at night.  Admitted for pneumonia, currently on Rocephin and azithromycin.  Please refer to H&P dictated by my partner Dr. Antionette Char on 05/07/2020 for further details of the assessment and plan.

## 2020-05-08 ENCOUNTER — Inpatient Hospital Stay (HOSPITAL_COMMUNITY): Payer: 59

## 2020-05-08 DIAGNOSIS — I5042 Chronic combined systolic (congestive) and diastolic (congestive) heart failure: Secondary | ICD-10-CM

## 2020-05-08 DIAGNOSIS — I340 Nonrheumatic mitral (valve) insufficiency: Secondary | ICD-10-CM

## 2020-05-08 DIAGNOSIS — I5031 Acute diastolic (congestive) heart failure: Secondary | ICD-10-CM

## 2020-05-08 LAB — BASIC METABOLIC PANEL
Anion gap: 8 (ref 5–15)
BUN: 12 mg/dL (ref 6–20)
CO2: 23 mmol/L (ref 22–32)
Calcium: 8.7 mg/dL — ABNORMAL LOW (ref 8.9–10.3)
Chloride: 102 mmol/L (ref 98–111)
Creatinine, Ser: 0.85 mg/dL (ref 0.61–1.24)
GFR, Estimated: >60 mL/min (ref >60)
Glucose, Bld: 87 mg/dL (ref 70–99)
Potassium: 3.7 mmol/L (ref 3.5–5.1)
Sodium: 133 mmol/L — ABNORMAL LOW (ref 135–145)

## 2020-05-08 LAB — CBC
HCT: 35.3 % — ABNORMAL LOW (ref 39.0–52.0)
Hemoglobin: 11.7 g/dL — ABNORMAL LOW (ref 13.0–17.0)
MCH: 30.2 pg (ref 26.0–34.0)
MCHC: 33.1 g/dL (ref 30.0–36.0)
MCV: 91 fL (ref 80.0–100.0)
Platelets: 183 10*3/uL (ref 150–400)
RBC: 3.88 MIL/uL — ABNORMAL LOW (ref 4.22–5.81)
RDW: 15.1 % (ref 11.5–15.5)
WBC: 13.5 10*3/uL — ABNORMAL HIGH (ref 4.0–10.5)
nRBC: 0 % (ref 0.0–0.2)

## 2020-05-08 LAB — ECHOCARDIOGRAM COMPLETE
AR max vel: 2.4 cm2
AV Area VTI: 2.33 cm2
AV Area mean vel: 2.36 cm2
AV Mean grad: 3 mmHg
AV Peak grad: 4.4 mmHg
Ao pk vel: 1.05 m/s
Area-P 1/2: 8.72 cm2
Calc EF: 37.6 %
Height: 72 in
MV M vel: 3.77 m/s
MV Peak grad: 56.9 mmHg
Radius: 0.8 cm
S' Lateral: 5.5 cm
Single Plane A2C EF: 40.7 %
Single Plane A4C EF: 34.8 %
Weight: 4521.6 oz

## 2020-05-08 LAB — EXPECTORATED SPUTUM ASSESSMENT W GRAM STAIN, RFLX TO RESP C

## 2020-05-08 LAB — HIV ANTIBODY (ROUTINE TESTING W REFLEX): HIV Screen 4th Generation wRfx: NONREACTIVE

## 2020-05-08 LAB — PROCALCITONIN: Procalcitonin: 0.16 ng/mL

## 2020-05-08 LAB — MAGNESIUM: Magnesium: 2 mg/dL (ref 1.7–2.4)

## 2020-05-08 MED ORDER — FUROSEMIDE 10 MG/ML IJ SOLN
40.0000 mg | Freq: Every day | INTRAMUSCULAR | Status: DC
  Administered 2020-05-08 – 2020-05-10 (×3): 40 mg via INTRAVENOUS
  Filled 2020-05-08 (×3): qty 4

## 2020-05-08 MED ORDER — POTASSIUM CHLORIDE CRYS ER 20 MEQ PO TBCR
40.0000 meq | EXTENDED_RELEASE_TABLET | Freq: Every day | ORAL | Status: AC
  Administered 2020-05-08 – 2020-05-11 (×4): 40 meq via ORAL
  Filled 2020-05-08 (×4): qty 2

## 2020-05-08 NOTE — Progress Notes (Signed)
PROGRESS NOTE  Algis LimingRicky Hufstedler ZOX:[id number] DOB: 30-Jan-1960 DOA: 05/07/2020 PCP: Bufford Buttneruriale, Anthony, PA-C  HPI/Recap of past 24 hours: Todd Pridgenis a 60 y.o.malewith medical history significant forchronic combined systolic and diastolic CHF, hypothyroidism, coronary artery disease, OSA on CPAP, hypothyroidism, anxiety, and chronic pain, now presenting to the emergency department for evaluation of shortness of breath, productive cough, chest pain, and lightheadedness. Patient reports 2 to 3 days of shortness of breath, initially with exertion but worsening to the point where he is now dyspneic at rest. This has been accompanied by a cough productive of thick sputum. He then went on to develop pleuritic chest discomfort and lightheadedness last night which prompted his presentation. He has not noticed any recent leg swelling, has not had subjective fevers or chills, and denies any diaphoresis or nausea. Patient notes that he had gastric bypass surgery 6 months ago and has lost 100 pounds.  Work-up revealed multifocal pneumonia and acute exacerbation of CHF, unspecified.  On IV antibiotics empirically and on IV diuretics.  Cardiology following.  05/08/20:  Seen and examined at his bedside.  He reports he feels better today.  Breathing is improved.  Rales noted on lungs auscultation.  Ongoing diuresing.  Appreciate cardiology's assistance.  Assessment/Plan: Principal Problem:   Multifocal pneumonia Active Problems:   Chest pain   Chronic combined systolic and diastolic CHF (congestive heart failure) (HCC)   CAD (coronary artery disease)   Anxiety   Hypothyroidism   OSA on CPAP  Multifocal pneumonia, POA Presented with shortness of breath, productive cough, chest pain, lightheadedness. Troponin negative x2. No evidence of acute ischemia on 12 EKG. Leukocytosis and evidence of pulmonary infiltrates on chest x-ray and CT chest. Started on Rocephin and azithromycin on admission,  continue. Follow sputum culture.  Acute on chronic combined diastolic and systolic CHF Presented with elevated BNP greater than 2000, pulmonary edema seen on chest x-ray and on CT chest. Started on IV diuretics, IV Lasix 40 mg daily. Monitor urine output Net I&O -475 cc  Dyspnea likely secondary to above No evidence of pulmonary embolism on CT angio chest Maintaining saturation greater than 93%. Mobilize as tolerated Incentive spirometer  Hypothyroidism Continue home Synthroid  OSA CPAP qhs  Coronary artery disease Denies any anginal symptoms at time of this visit Continue home aspirin statin beta-blocker. Cardiology following.  Chronic anxiety Continue home Prozac  Chronic pain Continue home regimen, gabapentin and as needed oxycodone.   Code Status: Full code.  Family Communication: Updated his wife at bedside.  Disposition Plan: Discharge to Home likely on 05/09/2020 or when cardiologist signs off.   Consultants:  Cardiology.  Procedures:  2D echo.  Antimicrobials:  Rocephin  Azithromycin.  DVT prophylaxis: Subcu Lovenox daily.  Status is: Inpatient   Dispo:  Patient From: Home  Planned Disposition: Home  Medically stable for discharge: No          Objective: Vitals:   05/07/20 2015 05/07/20 2250 05/08/20 0447 05/08/20 0831  BP: 112/61  (!) 108/58 110/70  Pulse:  68 92 95  Resp:  18 16   Temp: 98.1 F (36.7 C)  98.3 F (36.8 C)   TempSrc: Oral  Oral   SpO2:  96% 98%   Weight:   128.2 kg   Height:        Intake/Output Summary (Last 24 hours) at 05/08/2020 1714 Last data filed at 05/08/2020 1211 Gross per 24 hour  Intake 250 ml  Output 1100 ml  Net -850 ml   Filed  Weights   05/07/20 0602 05/08/20 0447  Weight: 126.4 kg 128.2 kg    Exam:  . General: 60 y.o. year-old male well developed well nourished in no acute distress.  Alert and oriented x3. . Cardiovascular: Regular rate and rhythm with no rubs or gallops.  No  thyromegaly or JVD noted.   Marland Kitchen Respiratory: Mild rales at bases with no wheezing noted.  Poor inspiratory effort.  . Abdomen: Soft nontender nondistended with normal bowel sounds x4 quadrants. . Musculoskeletal: Trace lower extremity edema. 2/4 pulses in all 4 extremities. . Skin: No ulcerative lesions noted or rashes, . Psychiatry: Mood is appropriate for condition and setting   Data Reviewed: CBC: Recent Labs  Lab 05/07/20 0211 05/07/20 0237 05/08/20 0247  WBC 11.0*  --  13.5*  NEUTROABS 9.2*  --   --   HGB 12.3* 12.6* 11.7*  HCT 36.9* 37.0* 35.3*  MCV 91.3  --  91.0  PLT 203  --  183   Basic Metabolic Panel: Recent Labs  Lab 05/07/20 0237 05/08/20 0247  NA 137 133*  K 3.8 3.7  CL 103 102  CO2  --  23  GLUCOSE 105* 87  BUN 13 12  CREATININE 0.80 0.85  CALCIUM  --  8.7*   GFR: Estimated Creatinine Clearance: 129.4 mL/min (by C-G formula based on SCr of 0.85 mg/dL). Liver Function Tests: No results for input(s): AST, ALT, ALKPHOS, BILITOT, PROT, ALBUMIN in the last 168 hours. No results for input(s): LIPASE, AMYLASE in the last 168 hours. No results for input(s): AMMONIA in the last 168 hours. Coagulation Profile: No results for input(s): INR, PROTIME in the last 168 hours. Cardiac Enzymes: No results for input(s): CKTOTAL, CKMB, CKMBINDEX, TROPONINI in the last 168 hours. BNP (last 3 results) No results for input(s): PROBNP in the last 8760 hours. HbA1C: No results for input(s): HGBA1C in the last 72 hours. CBG: No results for input(s): GLUCAP in the last 168 hours. Lipid Profile: No results for input(s): CHOL, HDL, LDLCALC, TRIG, CHOLHDL, LDLDIRECT in the last 72 hours. Thyroid Function Tests: No results for input(s): TSH, T4TOTAL, FREET4, T3FREE, THYROIDAB in the last 72 hours. Anemia Panel: No results for input(s): VITAMINB12, FOLATE, FERRITIN, TIBC, IRON, RETICCTPCT in the last 72 hours. Urine analysis: No results found for: COLORURINE, APPEARANCEUR,  LABSPEC, PHURINE, GLUCOSEU, HGBUR, BILIRUBINUR, KETONESUR, PROTEINUR, UROBILINOGEN, NITRITE, LEUKOCYTESUR Sepsis Labs: @LABRCNTIP (procalcitonin:4,lacticidven:4)  ) Recent Results (from the past 240 hour(s))  Resp Panel by RT-PCR (Flu A&B, Covid) Nasopharyngeal Swab     Status: None   Collection Time: 05/07/20  2:41 AM   Specimen: Nasopharyngeal Swab; Nasopharyngeal(NP) swabs in vial transport medium  Result Value Ref Range Status   SARS Coronavirus 2 by RT PCR NEGATIVE NEGATIVE Final    Comment: (NOTE) SARS-CoV-2 target nucleic acids are NOT DETECTED.  The SARS-CoV-2 RNA is generally detectable in upper respiratory specimens during the acute phase of infection. The lowest concentration of SARS-CoV-2 viral copies this assay can detect is 138 copies/mL. A negative result does not preclude SARS-Cov-2 infection and should not be used as the sole basis for treatment or other patient management decisions. A negative result may occur with  improper specimen collection/handling, submission of specimen other than nasopharyngeal swab, presence of viral mutation(s) within the areas targeted by this assay, and inadequate number of viral copies(<138 copies/mL). A negative result must be combined with clinical observations, patient history, and epidemiological information. The expected result is Negative.  Fact Sheet for Patients:  05/09/20  Fact  Sheet for Healthcare Providers:  SeriousBroker.it  This test is no t yet approved or cleared by the [place] FDA and  has been authorized for detection and/or diagnosis of SARS-CoV-2 by FDA under an Emergency Use Authorization (EUA). This EUA will remain  in effect (meaning this test can be used) for the duration of the COVID-19 declaration under Section 564(b)(1) of the Act, 21 U.S.C.section 360bbb-3(b)(1), unless the authorization is terminated  or revoked sooner.       Influenza A  by PCR NEGATIVE NEGATIVE Final   Influenza B by PCR NEGATIVE NEGATIVE Final    Comment: (NOTE) The Xpert Xpress SARS-CoV-2/FLU/RSV plus assay is intended as an aid in the diagnosis of influenza from Nasopharyngeal swab specimens and should not be used as a sole basis for treatment. Nasal washings and aspirates are unacceptable for Xpert Xpress SARS-CoV-2/FLU/RSV testing.  Fact Sheet for Patients: BloggerCourse.com  Fact Sheet for Healthcare Providers: SeriousBroker.it  This test is not yet approved or cleared by the [place] FDA and has been authorized for detection and/or diagnosis of SARS-CoV-2 by FDA under an Emergency Use Authorization (EUA). This EUA will remain in effect (meaning this test can be used) for the duration of the COVID-19 declaration under Section 564(b)(1) of the Act, 21 U.S.C. section 360bbb-3(b)(1), unless the authorization is terminated or revoked.  Performed at [facility], [address] [place], [place] [address]   Blood culture (routine x 2)     Status: None (Preliminary result)   Collection Time: 05/07/20  3:24 AM   Specimen: BLOOD RIGHT HAND  Result Value Ref Range Status   Specimen Description BLOOD RIGHT HAND  Final   Special Requests   Final    BOTTLES DRAWN AEROBIC AND ANAEROBIC Blood Culture adequate volume   Culture   Final    NO GROWTH 1 DAY Performed at [facility], [address] [place], [place] [address]    Report Status PENDING  Incomplete  Blood culture (routine x 2)     Status: None (Preliminary result)   Collection Time: 05/07/20  3:30 AM   Specimen: BLOOD LEFT HAND  Result Value Ref Range Status   Specimen Description BLOOD LEFT HAND  Final   Special Requests   Final    BOTTLES DRAWN AEROBIC AND ANAEROBIC Blood Culture adequate volume   Culture   Final    NO GROWTH 1 DAY Performed at [facility], [address] [place], [place] [address]    Report  Status PENDING  Incomplete  Culture, sputum-assessment     Status: None   Collection Time: 05/07/20  3:25 PM   Specimen: Expectorated Sputum  Result Value Ref Range Status   Specimen Description EXPECTORATED SPUTUM  Final   Special Requests NONE  Final   Sputum evaluation   Final    THIS SPECIMEN IS ACCEPTABLE FOR SPUTUM CULTURE Performed at [facility], [address] [place], [place] [address]    Report Status 05/08/2020 FINAL  Final  Culture, Respiratory w Gram Stain     Status: None (Preliminary result)   Collection Time: 05/07/20  3:25 PM  Result Value Ref Range Status   Specimen Description EXPECTORATED SPUTUM  Final   Special Requests NONE Reflexed from [id number]  Final   Gram Stain   Final    ABUNDANT WBC PRESENT, PREDOMINANTLY PMN FEW GRAM POSITIVE COCCI IN CLUSTERS RARE GRAM POSITIVE RODS RARE GRAM NEGATIVE RODS Performed at [facility],  [address], [place], [place] [address]    Culture PENDING  Incomplete   Report Status PENDING  Incomplete      Studies: No results found.  Scheduled Meds: . aspirin EC  81 mg Oral Daily  . atorvastatin  80 mg Oral Daily  . chlorhexidine  15 mL Mouth Rinse BID  . enoxaparin (LOVENOX) injection  0.5 mg/kg Subcutaneous Daily  . FLUoxetine  20 mg Oral Daily  . furosemide  40 mg Intravenous Daily  . gabapentin  600 mg Oral TID  . levothyroxine  200 mcg Oral Q0600  . mouth rinse  15 mL Mouth Rinse q12n4p  . metoprolol succinate  100 mg Oral Daily  . pantoprazole  40 mg Oral Daily  . potassium chloride  40 mEq Oral Daily  . sacubitril-valsartan  1 tablet Oral BID  . spironolactone  25 mg Oral Daily    Continuous Infusions: . azithromycin 500 mg (05/08/20 0842)  . cefTRIAXone (ROCEPHIN)  IV 2 g (05/07/20 1813)     LOS: 1 day     Darlin Drop, MD [facility] Pager [phone]  If 7PM-7AM, please contact night-coverage [email or web address] Password [facility] 05/08/2020, 5:14 PM

## 2020-05-08 NOTE — Evaluation (Signed)
Physical Therapy Evaluation Patient Details Name: Todd Rivas MRN: [id number] DOB: [birth year] Today's Date: 05/08/2020   History of Present Illness  Patient is a 60 y/o male who presents on 05/07/20 with chest pain, SOB, lightheadedness, and productive cough. CXR- concerning for PNA. PMH includes CAD, CHF, anxiety, AICD.  Clinical Impression  Patient presents with dyspnea on exertion and chronic back pain s/p above. Pt reports being independent for ADLs and wife does IADLs/driving PTA. Pt currently traveling visiting children. Today, pt tolerated transfers and gait training with mod I due to slower gait speed. Reports productive cough, not appreciated during session. VSS on RA with Sp02 >94%  and HR 90s-107 bpm with activity. No chest pain. Encouraged increasing activity while in the hospital- walking to bathroom and hallway ambulation 2-3 times daily. Pt does not require further skilled therapy services pt functioning close to baseline. Discharge from therapy.    Follow Up Recommendations No PT follow up    Equipment Recommendations  None recommended by PT    Recommendations for Other Services       Precautions / Restrictions Precautions Precautions: None Restrictions Weight Bearing Restrictions: No      Mobility  Bed Mobility               General bed mobility comments: Up in chair upon PT arrival.    Transfers Overall transfer level: Modified independent               General transfer comment: Stood from chair without difficulty.  Ambulation/Gait Ambulation/Gait assistance: Modified independent (Device/Increase time) Gait Distance (Feet): 400 Feet Assistive device: None Gait Pattern/deviations: Step-through pattern;Decreased stride length Gait velocity: 1.96 ft/sec Gait velocity interpretation: 1.31 - 2.62 ft/sec, indicative of limited community ambulator General Gait Details: Slow, mostly steady gait holding onto wrapped sheet around him due to being cold.  NO evidence of imbalance esp with head turns. VSS on RA with SP02 >95% and HR ranging from 90s-107 bpm.  Stairs            Wheelchair Mobility    Modified Rankin (Stroke Patients Only)       Balance Overall balance assessment: No apparent balance deficits (not formally assessed)                                           Pertinent Vitals/Pain Pain Assessment: Faces Faces Pain Scale: Hurts little more Pain Location: back- chronic Pain Descriptors / Indicators: Aching;Sore Pain Intervention(s): Monitored during session;Repositioned    Home Living Family/patient expects to be discharged to:: Private residence Living Arrangements: Spouse/significant other Available Help at Discharge: Family;Available 24 hours/day Type of Home: House Home Access: Stairs to enter Entrance Stairs-Rails: Right Entrance Stairs-Number of Steps: 3 Home Layout: One level Home Equipment: Cane - single point;Bedside commode;Shower seat - built in Additional Comments: Currently traveling visiting children and grandchildrenn.    Prior Function Level of Independence: Independent         Comments: Wife drives. Does own ADLs, Very little IADls. Walks with wife ~30 mins. Does not work.     Hand Dominance   Dominant Hand: Right    Extremity/Trunk Assessment   Upper Extremity Assessment Upper Extremity Assessment: Defer to OT evaluation    Lower Extremity Assessment Lower Extremity Assessment: Overall WFL for tasks assessed    Cervical / Trunk Assessment Cervical / Trunk Assessment: Normal  Communication   Communication:  No difficulties  Cognition Arousal/Alertness: Awake/alert Behavior During Therapy: WFL for tasks assessed/performed Overall Cognitive Status: Within Functional Limits for tasks assessed                                        General Comments General comments (skin integrity, edema, etc.): Reports productive cough, not appreciated  during session. VSS on RA with Sp02 >94%  and HR 90s-107 bpm with activity.    Exercises     Assessment/Plan    PT Assessment Patent does not need any further PT services  PT Problem List         PT Treatment Interventions      PT Goals (Current goals can be found in the Care Plan section)  Acute Rehab PT Goals Patient Stated Goal: to go home today PT Goal Formulation: All assessment and education complete, DC therapy    Frequency     Barriers to discharge        Co-evaluation               AM-PAC PT "6 Clicks" Mobility  Outcome Measure Help needed turning from your back to your side while in a flat bed without using bedrails?: None Help needed moving from lying on your back to sitting on the side of a flat bed without using bedrails?: None Help needed moving to and from a bed to a chair (including a wheelchair)?: None Help needed standing up from a chair using your arms (e.g., wheelchair or bedside chair)?: None Help needed to walk in hospital room?: None Help needed climbing 3-5 steps with a railing? : A Little 6 Click Score: 23    End of Session   Activity Tolerance: Patient tolerated treatment well Patient left: in chair;with call bell/phone within reach;with nursing/sitter in room Nurse Communication: Mobility status PT Visit Diagnosis: Other (comment) (dyspnea)    Time: 8882-8003 PT Time Calculation (min) (ACUTE ONLY): 18 min   Charges:   PT Evaluation $PT Eval Moderate Complexity: 1 Mod          Vale Haven, PT, DPT Acute Rehabilitation Services Pager [phone] Office [phone]      Blake Divine A Lanier Ensign 05/08/2020, 10:49 AM

## 2020-05-08 NOTE — Consult Note (Signed)
Cardiology Consultation:   Patient ID: Todd LimingRicky Clinch MRN: [id number]; DOB: Dec 06, 1960  Admit date: 05/07/2020 Date of Consult: 05/08/2020  PCP:  Herbert Punuriale, Anthony, PA-C   [facility]  Cardiologist:  [facility]  Patient Profile:   Todd Rivas is a [age] y.o. male with a hx of CAD, ischemic cardiomyopathy, chronic combined heart failure, dual chamber ICD in place, PAT, OSA on CPAP, HTN, HLD, hypothyroidism, anxiety, and obesity s/p gastric bypass who is being seen today for the evaluation of CHF at the request of Dr. Margo AyeHall.  History of Present Illness:   Todd Rivas follows with [facility]. He has a history of CAD and ischemic cardiomyopathy with dual chamber ICD in place. Hx of PCI in 2007, DES x 2 to mid and distal LCx and PTCA to OM (2014). Last cath in 2017 with patent stents in LAD and LCx. He was started on entresto, but did not fill our assistance paperwork (felt his cardiologist should do this for him or provide samples). He has a scales at home but was not weighing daily. He also does not try to avoid salt.   MDT ICD placed 2015.  Echo 09/28/18 with EF 35-40%, mild to moderate MR, mild TR, diastolic dysfunction.  Stress test 06/24/19 nonischemic. EF post-stress was 34%.    Per his last cardiology note dates 03/02/20, he was maintained on entresto 49-51 mg BID, 100 mg toprol, eplerenone 25 mg, 80 mg lipitor, 40 mg lasix, and 81 mg ASA.   Pt BIB EMS for irregular heart rate, per patient, and chest pain with shortness of breath. Pt also reports productive cough and lightheadedness. Pt reports DOE starting 2 days ago that has progressed to dyspnea at rest. This coincided with a productive cough of thick sputum. On 05/06/20, he developed pleuritic chest pain prompting evaluation in the ER. He had gastric bypass about 6 months ago and has lost about 100 lbs. He states that he has shortness of breath and anxiety since gastric bypass, anxiety about the foods and  drink he can consume. CTA negative for PE but showed multifocal infiltrates. COVDI-19 negative. Blood and sputum cultures collected. He was started on ABX for CAP.    Given his elevated BNP, he was started on 40 mg IV lasix BID. Cardiology was consulted. He describes possibly a "dirty CPAP machine" that he was using that has been causing SOB for the past month. Chest pain was pleuritic and not like his prior angina leading to PCI, which was severe pressure (elephant on chest). He does think that the 40 mg IV lasix has improved his breathing, although he forgot to use his CPAP last night. He reports not taking lasix since Feb, as instructed by his cardiology team. I do not see those instructions and did not see an alterative diuretic ordered.    Past Medical History:  Diagnosis Date  . AICD (automatic cardioverter/defibrillator) present   . Anxiety   . CHF (congestive heart failure) (HCC)   . Coronary artery disease   . Hypothyroidism   . Sleep apnea     Past Surgical History:  Procedure Laterality Date  . CARDIAC CATHETERIZATION    . GASTRIC BYPASS       Home Medications:  Prior to Admission medications   Medication Sig Start Date End Date Taking? Authorizing Provider  albuterol (VENTOLIN HFA) 108 (90 Base) MCG/ACT inhaler Inhale 2 puffs into the lungs every 6 (six) hours as needed for shortness of breath.   Yes [provider]  aspirin 81 MG EC tablet Take 81 mg by mouth daily. 03/01/12  Yes [provider]  atorvastatin (LIPITOR) 80 MG tablet Take 80 mg by mouth every evening. 04/13/20  Yes [provider]  ENTRESTO 49-51 MG Take 1 tablet by mouth 2 (two) times daily. 03/21/20  Yes [provider]  eplerenone (INSPRA) 25 MG tablet Take 25 mg by mouth daily. 04/20/20  Yes [provider]  FLUoxetine (PROZAC) 20 MG capsule Take 20 mg by mouth daily. 05/03/20  Yes [provider]  furosemide (LASIX) 40 MG tablet Take 40 mg by mouth daily as  needed for fluid. 10/17/17  Yes [provider]  gabapentin (NEURONTIN) 600 MG tablet Take 600-1,200 mg by mouth See admin instructions. Take 1 tablet by mouth in the morning, and 2 tablets by mouth in the evening   Yes [provider]  levothyroxine (SYNTHROID) 200 MCG tablet Take 200 mcg by mouth every morning. 06/30/17  Yes [provider]  magnesium oxide (MAG-OX) 400 (241.3 Mg) MG tablet Take 400 mg by mouth daily. 03/02/20  Yes [provider]  metoprolol succinate (TOPROL-XL) 100 MG 24 hr tablet Take 100 mg by mouth at bedtime. 10/11/15  Yes [provider]  nitroGLYCERIN (NITROSTAT) 0.4 MG SL tablet Place 0.4 mg under the tongue every 5 (five) minutes x 3 doses as needed for chest pain. 12/22/19  Yes [provider]  OVER THE COUNTER MEDICATION Take 1 capsule by mouth 2 (two) times daily. Super Beta Prostate   Yes [provider]  Oxycodone HCl 10 MG TABS Take 10 mg by mouth 2 (two) times daily as needed for pain. 04/18/20  Yes [provider]  pantoprazole (PROTONIX) 40 MG tablet Take 40 mg by mouth daily.   Yes [provider]  sildenafil (VIAGRA) 100 MG tablet Take 100 mg by mouth daily as needed for erectile dysfunction.   Yes [provider]    Inpatient Medications: Scheduled Meds: . aspirin EC  81 mg Oral Daily  . atorvastatin  80 mg Oral Daily  . chlorhexidine  15 mL Mouth Rinse BID  . enoxaparin (LOVENOX) injection  0.5 mg/kg Subcutaneous Daily  . FLUoxetine  20 mg Oral Daily  . furosemide  40 mg Intravenous Daily  . gabapentin  600 mg Oral TID  . levothyroxine  200 mcg Oral Q0600  . mouth rinse  15 mL Mouth Rinse q12n4p  . metoprolol succinate  100 mg Oral Daily  . pantoprazole  40 mg Oral Daily  . potassium chloride  40 mEq Oral Daily  . sacubitril-valsartan  1 tablet Oral BID  . spironolactone  25 mg Oral Daily   Continuous Infusions: . azithromycin 500 mg (05/08/20 0842)  .  cefTRIAXone (ROCEPHIN)  IV 2 g (05/07/20 1813)   PRN Meds: acetaminophen **OR** acetaminophen, albuterol, guaiFENesin, ondansetron **OR** ondansetron (ZOFRAN) IV, oxyCODONE, senna-docusate  Allergies:    Allergies  Allergen Reactions  . Lisinopril     Other reaction(s): Cough Other reaction(s): Cough     Social History:   Social History   Socioeconomic History  . Marital status: Married    Spouse name: Not on file  . Number of children: Not on file  . Years of education: Not on file  . Highest education level: Not on file  Occupational History  . Not on file  Tobacco Use  . Smoking status: Never Smoker  . Smokeless tobacco: Never Used  Substance and Sexual Activity  .  Alcohol use: Not Currently  . Drug use: Not Currently  . Sexual activity: Not on file  Other Topics Concern  . Not on file  Social History Narrative  . Not on file   Social Determinants of Health   Financial Resource Strain: Not on file  Food Insecurity: Not on file  Transportation Needs: Not on file  Physical Activity: Not on file  Stress: Not on file  Social Connections: Not on file  Intimate Partner Violence: Not on file    Family History:    Family History  Problem Relation Age of Onset  . Arrhythmia Mother      ROS:  Please see the history of present illness.   All other ROS reviewed and negative.     Physical Exam/Data:   Vitals:   05/07/20 2015 05/07/20 2250 05/08/20 0447 05/08/20 0831  BP: 112/61  (!) 108/58 110/70  Pulse:  68 92 95  Resp:  18 16   Temp: 98.1 F (36.7 C)  98.3 F (36.8 C)   TempSrc: Oral  Oral   SpO2:  96% 98%   Weight:   128.2 kg   Height:        Intake/Output Summary (Last 24 hours) at 05/08/2020 1329 Last data filed at 05/08/2020 1211 Gross per 24 hour  Intake 250 ml  Output 1225 ml  Net -975 ml   Last 3 Weights 05/08/2020 05/07/2020  Weight (lbs) 282 lb 9.6 oz 278 lb 9.6 oz  Weight (kg) 128.187 kg 126.372 kg     Body mass index is 38.33 kg/m.   General:  Obese male in NAD Neck: no JVD - exam difficult Vascular: No carotid bruits  Cardiac:  normal S1, S2; RRR; no murmur  Lungs:  Clear on anterior lung exam during echocardiogram  Abd: soft, nontender, no hepatomegaly  Ext: minimal B LE edema Musculoskeletal:  No deformities, BUE and BLE strength normal and equal Skin: warm and dry  Neuro:  CNs 2-12 intact, no focal abnormalities noted Psych:  Normal affect   EKG:  The EKG was personally reviewed and demonstrates:  Sinus HR 90 nonspecific IVCD Telemetry:  Telemetry was personally reviewed and demonstrates:  Sinus rhythm in the 90s with frequent PVCs  Relevant CV Studies: Gathered from Care Everywhere Catheterization History LHC and PCI/DES x 2 to mid and distal LCx and PTCA to OM 02/28/12 (Dr. Wynona Neat) Coronary angiography was performed using multiple angulations are all coronary arteries were well visualized. Left main coronary artery: Originated from the left coronary cusp. It was large in size. Mild distal irregularities are noted. Left anterior descending artery: Originated from the distal left main coronary artery. Medium in size. There is a widely patent stent at the first diagonal branch with 25% in stent restenosis present. The first diagonal branch also has an ostial stent which has 50% ostial in-stent restenosis. The remainder of LAD and diagonal branch vessels are free of significant disease. Left Circumflex artery: Originated from thedistal left main coronary artery and extended in the AV groove. Medium in size. There is a large first marginal branch which has 25% ostial narrowing. The ongoing left circumflex artery has a 98% stenosis followed by a medium-sized second marginal branch which is free of significant disease. The ongoing left circumflex artery has 70% distal disease. Right coronary artery: Originated from the right coronary cusp. Dominant vessel. Medium in size. There is 25% proximal stenosis and  a 30-40% eccentric mid stenosis of the RCA. The distal vessel branches into the  posterolateral and posterior descending branches which are normal in appearance. Left ventriculography: Not performed. AV crossed with guide catheter after PCI for LV pressure measurements.  Conclusions:  Successful drug-eluting stent X 2 to the mid and distal LCx artery Successful PTCA of the first marginal branch artery   R/LHC 11/17/15 (Dr. Clearance Coots) Findings: Hemodynamics: Central aortic pressure: 112/76/91 LV pressure: 111/33 PCWP: 33 PA: 52/29/38 RV: 51/16 RA: 19 Thermodilution cardiac output: 5.80 L/M, index 2.19 Fick cardiac output: 5.59 L/M, index 2.11 Left ventriculography: severe global impairment of LV contraction, LVEF visually estimated at 25% Coronary angiography: The coronary circulation is right dominant Left main: normal LAD: Scattered luminal plaque, patent stent at LAD/D1 CFX: Scattered luminal plaque, patent stent in the proximal/mid segments RCA: Minor luminal plaque  Impression: 1. Severe global impairment of LV function 2. No evidence of significant obstructive CAD 3. Patent LAD, CFX stents 4. Moderate pulmonary hypertension 5. Elevated filling pressures consistent with volume overload   Echo 10/08/18: The left ventricle is moderately dilated. Global hypokinesis of the LV worse in the inferior wall. Diastolic function:abnormal. The left ventricular function is moderately decreased. The estimated left ventricular ejection fraction is 35-40%. Mild to moderate MR.    Echocardiogram 02/28/12: The left ventricle is mildly dilated with mild concentric left ventricularhypertrophy seen. Segmental wall motion abnormalities are present as described, consistent with an ischemic cardiomyopathy. Global left ventricular systolic function is severely reduced. The estimated left ventricular ejection fraction is 35 - 40%. There is impaired diastolic relaxation. Mild tricuspid  regurgitation is seen. The pulmonary artery pressure could not be estimated from the TR jet dueto technical limitations. There is no evidence of pericardial effusion.   Laboratory Data:  High Sensitivity Troponin:   Recent Labs  Lab 05/07/20 0211 05/07/20 0411  TROPONINIHS 6 6     Chemistry Recent Labs  Lab 05/07/20 0237 05/08/20 0247  NA 137 133*  K 3.8 3.7  CL 103 102  CO2  --  23  GLUCOSE 105* 87  BUN 13 12  CREATININE 0.80 0.85  CALCIUM  --  8.7*  GFRNONAA  --  >60  ANIONGAP  --  8    No results for input(s): PROT, ALBUMIN, AST, ALT, ALKPHOS, BILITOT in the last 168 hours. Hematology Recent Labs  Lab 05/07/20 0211 05/07/20 0237 05/08/20 0247  WBC 11.0*  --  13.5*  RBC 4.04*  --  3.88*  HGB 12.3* 12.6* 11.7*  HCT 36.9* 37.0* 35.3*  MCV 91.3  --  91.0  MCH 30.4  --  30.2  MCHC 33.3  --  33.1  RDW 14.8  --  15.1  PLT 203  --  183   BNP Recent Labs  Lab 05/07/20 0223  BNP 1,095.8*    DDimer No results for input(s): DDIMER in the last 168 hours.   Radiology/Studies:  CT Angio Chest PE W and/or Wo Contrast  Result Date: 05/07/2020 CLINICAL DATA:  Chest pain and shortness of breath. EXAM: CT ANGIOGRAPHY CHEST WITH CONTRAST TECHNIQUE: Multidetector CT imaging of the chest was performed using the standard protocol during bolus administration of intravenous contrast. Multiplanar CT image reconstructions and MIPs were obtained to evaluate the vascular anatomy. CONTRAST:  3mL OMNIPAQUE IOHEXOL 350 MG/ML SOLN COMPARISON:  Chest radiograph 05/07/2020 FINDINGS: Cardiovascular: Examination is limited by streak artifact and motion. There is moderately good opacification of the central and proximal segmental pulmonary arteries. No focal filling defects demonstrated. No evidence of significant pulmonary embolus. Cardiac enlargement. No pericardial effusions. Coronary artery  and aortic calcification. No aortic aneurysm. Cardiac pacemaker. Mediastinum/Nodes: Esophagus is  decompressed. No significant lymphadenopathy in the chest. Lungs/Pleura: Motion artifact limits examination. There are small bilateral pleural effusions. Nodular perihilar infiltrates in both lungs, greater on the right. Changes likely represent multifocal bronchopneumonia. Edema would be less likely to have this appearance. No pneumothorax. Upper Abdomen: No acute abnormalities demonstrated in the visualized upper abdomen. Postoperative changes consistent with gastric bypass. Cyst in the upper pole of the left kidney. Musculoskeletal: Degenerative changes in the spine. Review of the MIP images confirms the above findings. IMPRESSION: 1. No evidence of significant pulmonary embolus. 2. Cardiac enlargement. 3. Nodular perihilar infiltrates in both lungs, greater on the right, likely representing multifocal bronchopneumonia. Edema would be less likely to have this appearance. 4. Small bilateral pleural effusions. 5. Aortic atherosclerosis. 6. Postoperative changes consistent with gastric bypass. Aortic Atherosclerosis (ICD10-I70.0). Electronically Signed   By: Burman Nieves M.D.   On: 05/07/2020 03:37   DG Chest Portable 1 View  Result Date: 05/07/2020 CLINICAL DATA:  Chest pain and abnormal heart rate. EXAM: PORTABLE CHEST 1 VIEW COMPARISON:  None. FINDINGS: Cardiac pacemaker. Cardiac enlargement. Right perihilar and middle lobe infiltrate suggesting pneumonia. No pleural effusions. No pneumothorax. Mediastinal contours appear intact. IMPRESSION: Cardiac enlargement. Right perihilar and middle lobe infiltrate suggesting pneumonia. Electronically Signed   By: Burman Nieves M.D.   On: 05/07/2020 02:49     Assessment and Plan:   Acute on chronic systolic and diastolic heart failure Ischemic cardiomyopathy Dyspnea at rest ICD in place - BNP 1100 - CXR with RML PNA - diuresing on 40 mg IV lasix BID - has diuresed 1.1 L - creatinine and K stable at 0.85, 3.7 - continue BB - entresto and eplerenone  on hold for soft BP - echo pending - has been in the 35-40% range, last echo 2020 - restart heart failure medications when able - agree with diuresis   PNA, productive sputum, pleuritic chest pain, dyspnea at rest - ABX per primary - infiltrates on CTA - WBC 13.5   CAD s/p PCI - last heart cath 2017 with patent stents - troponin x 2 negative - continue ASA, statin, BB - do not suspect ACS - chest pain likely related to his PNA   PVCs, bigeminy - will check Mg - ICD in place - he is generally unaware of his PVCs - continue BB    Hyperlipidemia with LDL goal < 70 - continue 80 mg lipitor - defer management to primary cardiology   Hypertension - will restart GDMT for CHF as BP allows   Obesity s/p gastric bypass surger approximately 6 months ago - has lost 100 lbs - this is a source of anxiety for him - he does not weight daily   OSA on CPAP - compliant at home - did not use last evening   Risk Assessment/Risk Scores:   New York Heart Association (NYHA) Functional Class NYHA Class IV    For questions or updates, please contact [facility] Please consult [email or web address] for contact info under    Signed, Marcelino Duster, PA  05/08/2020 1:29 PM

## 2020-05-08 NOTE — Evaluation (Signed)
Occupational Therapy Evaluation Patient Details Name: Todd Rivas MRN: [id number] DOB: [birth year] Today's Date: 05/08/2020    History of Present Illness Patient is a 60 y/o male who presents on 05/07/20 with chest pain, SOB, lightheadedness, and productive cough. CXR- concerning for PNA. PMH includes CAD, CHF, anxiety, AICD.   Clinical Impression   Pt is functioning modified independently in ADL and ADL transfers. He brings his feet up upon the bed to don socks, but also owns a sock aide. Recommended reacher and long handled bath sponge. Verbally instructed in energy conservation and pacing as pt demonstrates dyspnea. Son thinks pt was not thoroughly cleaning his CPAP. No further OT needs.     Follow Up Recommendations  No OT follow up    Equipment Recommendations  None recommended by OT    Recommendations for Other Services       Precautions / Restrictions Precautions Precautions: None Restrictions Weight Bearing Restrictions: No      Mobility Bed Mobility Overal bed mobility: Modified Independent             General bed mobility comments: Up in chair upon PT arrival.    Transfers Overall transfer level: Modified independent Equipment used: None             General transfer comment: no difficulty    Balance Overall balance assessment: No apparent balance deficits (not formally assessed)                                         ADL either performed or assessed with clinical judgement   ADL Overall ADL's : Modified independent                                             Vision Patient Visual Report: No change from baseline       Perception     Praxis      Pertinent Vitals/Pain Pain Assessment: Faces Faces Pain Scale: Hurts little more Pain Location: back- chronic Pain Descriptors / Indicators: Aching;Sore Pain Intervention(s): Monitored during session     Hand Dominance Right   Extremity/Trunk  Assessment Upper Extremity Assessment Upper Extremity Assessment: Overall WFL for tasks assessed   Lower Extremity Assessment Lower Extremity Assessment: Defer to PT evaluation   Cervical / Trunk Assessment Cervical / Trunk Assessment: Normal   Communication Communication Communication: No difficulties   Cognition Arousal/Alertness: Awake/alert Behavior During Therapy: WFL for tasks assessed/performed Overall Cognitive Status: Within Functional Limits for tasks assessed                                     General Comments  Reports productive cough, not appreciated during session. VSS on RA with Sp02 >94%  and HR 90s-107 bpm with activity.    Exercises     Shoulder Instructions      Home Living Family/patient expects to be discharged to:: Private residence Living Arrangements: Spouse/significant other Available Help at Discharge: Family;Available 24 hours/day Type of Home: House Home Access: Stairs to enter [employer] of Steps: 3 Entrance Stairs-Rails: Right Home Layout: One level     Bathroom Shower/Tub: Producer, television/film/video: Standard     Home Equipment: Cane - single  point;Bedside commode;Shower seat - built Designer, fashion/clothing: Sock aid Additional Comments: Currently traveling visiting children and grandchildrenn.      Prior Functioning/Environment Level of Independence: Independent        Comments: Wife drives. Does own ADLs, Very little IADls. Walks with wife ~30 mins. Does not work.        OT Problem List:        OT Treatment/Interventions:      OT Goals(Current goals can be found in the care plan section) Acute Rehab OT Goals Patient Stated Goal: to go home today  OT Frequency:     Barriers to D/C:            Co-evaluation              AM-PAC OT "6 Clicks" Daily Activity     Outcome Measure Help from another person eating meals?: None Help from another person taking care  of personal grooming?: None Help from another person toileting, which includes using toliet, bedpan, or urinal?: None Help from another person bathing (including washing, rinsing, drying)?: None Help from another person to put on and taking off regular upper body clothing?: None Help from another person to put on and taking off regular lower body clothing?: None 6 Click Score: 24   End of Session    Activity Tolerance: Patient tolerated treatment well Patient left: in bed;with call bell/phone within reach;with family/visitor present  OT Visit Diagnosis: Pain                Time: 1100-1113 OT Time Calculation (min): 13 min Charges:  OT General Charges $OT Visit: 1 Visit OT Evaluation $OT Eval Low Complexity: 1 Low  Martie Round, OTR/L Acute Rehabilitation Services Pager: [phone] Office: [phone]  Evern Bio 05/08/2020, 11:16 AM

## 2020-05-08 NOTE — Progress Notes (Signed)
  Echocardiogram 2D Echocardiogram has been performed.  Gerda Diss 05/08/2020, 3:31 PM

## 2020-05-09 ENCOUNTER — Inpatient Hospital Stay (HOSPITAL_COMMUNITY): Payer: 59

## 2020-05-09 ENCOUNTER — Other Ambulatory Visit (HOSPITAL_COMMUNITY): Payer: Self-pay

## 2020-05-09 DIAGNOSIS — I5020 Unspecified systolic (congestive) heart failure: Secondary | ICD-10-CM

## 2020-05-09 DIAGNOSIS — G4733 Obstructive sleep apnea (adult) (pediatric): Secondary | ICD-10-CM

## 2020-05-09 DIAGNOSIS — J189 Pneumonia, unspecified organism: Secondary | ICD-10-CM

## 2020-05-09 DIAGNOSIS — R9389 Abnormal findings on diagnostic imaging of other specified body structures: Secondary | ICD-10-CM

## 2020-05-09 DIAGNOSIS — Z9989 Dependence on other enabling machines and devices: Secondary | ICD-10-CM

## 2020-05-09 LAB — BASIC METABOLIC PANEL
Anion gap: 10 (ref 5–15)
BUN: 9 mg/dL (ref 6–20)
CO2: 22 mmol/L (ref 22–32)
Calcium: 9.1 mg/dL (ref 8.9–10.3)
Chloride: 101 mmol/L (ref 98–111)
Creatinine, Ser: 0.81 mg/dL (ref 0.61–1.24)
GFR, Estimated: >60 mL/min (ref >60)
Glucose, Bld: 86 mg/dL (ref 70–99)
Potassium: 3.7 mmol/L (ref 3.5–5.1)
Sodium: 133 mmol/L — ABNORMAL LOW (ref 135–145)

## 2020-05-09 LAB — CBC
HCT: 36.3 % — ABNORMAL LOW (ref 39.0–52.0)
Hemoglobin: 12.2 g/dL — ABNORMAL LOW (ref 13.0–17.0)
MCH: 30.3 pg (ref 26.0–34.0)
MCHC: 33.6 g/dL (ref 30.0–36.0)
MCV: 90.1 fL (ref 80.0–100.0)
Platelets: 185 10*3/uL (ref 150–400)
RBC: 4.03 MIL/uL — ABNORMAL LOW (ref 4.22–5.81)
RDW: 15.1 % (ref 11.5–15.5)
WBC: 10.3 10*3/uL (ref 4.0–10.5)
nRBC: 0 % (ref 0.0–0.2)

## 2020-05-09 LAB — LEGIONELLA PNEUMOPHILA SEROGP 1 UR AG: L. pneumophila Serogp 1 Ur Ag: NEGATIVE

## 2020-05-09 LAB — PROCALCITONIN: Procalcitonin: 0.18 ng/mL

## 2020-05-09 LAB — MAGNESIUM: Magnesium: 1.9 mg/dL (ref 1.7–2.4)

## 2020-05-09 MED ORDER — MELATONIN 3 MG PO TABS
3.0000 mg | ORAL_TABLET | Freq: Every day | ORAL | Status: DC
  Administered 2020-05-09 – 2020-05-10 (×2): 3 mg via ORAL
  Filled 2020-05-09 (×2): qty 1

## 2020-05-09 NOTE — Evaluation (Signed)
Clinical/Bedside Swallow Evaluation Patient Details  Name: Todd Rivas MRN: [id number] Date of Birth: June 15, 1960  Today's Date: 05/09/2020 Time: SLP Start Time (ACUTE ONLY): 0854 SLP Stop Time (ACUTE ONLY): 0909 SLP Time Calculation (min) (ACUTE ONLY): 15 min  Past Medical History:  Past Medical History:  Diagnosis Date  . AICD (automatic cardioverter/defibrillator) present   . Anxiety   . CHF (congestive heart failure) (HCC)   . Coronary artery disease   . Hypothyroidism   . Sleep apnea    Past Surgical History:  Past Surgical History:  Procedure Laterality Date  . CARDIAC CATHETERIZATION    . GASTRIC BYPASS     HPI:  Pt is a 60 yo male presenting wtih CP, SOB, lightheadedness, and productive cough. CXR concerning for PNA; CT suggestive of multifocal bronchopneumonia with perihilar infiltrates R > L.  PMH includes CAD, CHF, anxiety, AICD, gastric bypass   Assessment / Plan / Recommendation Clinical Impression  Pt's oropharyngeal swallowing appears to be [facility], and he denies any subjective complaints. He modifies his eating patterns with Mod I to accommodate changes since his gastric bypass surgery to reduce his symptoms of reflux, and he has also been ordering softer foods since he has been in the hospital since he doesn't have his dentures. Only finding of potential significance would be subtle deviation of his tongue toward his R during his oral motor exam, which does not interfere with function. Recommend continuing with regular solids and thin liquids. SLP to sign off.  SLP Visit Diagnosis: Dysphagia, unspecified (R13.10)    Aspiration Risk  No limitations    Diet Recommendation Regular;Thin liquid   Liquid Administration via: Cup;Straw Medication Administration: Whole meds with liquid Supervision: Patient able to self feed;Intermittent supervision to cue for compensatory strategies Compensations: Slow rate;Small sips/bites Postural Changes: Seated upright at 90  degrees;Remain upright for at least 30 minutes after po intake    Other  Recommendations Oral Care Recommendations: Oral care BID   Follow up Recommendations None      Frequency and Duration            Prognosis Prognosis for Safe Diet Advancement: Good      Swallow Study   General HPI: Pt is a 60 yo male presenting wtih CP, SOB, lightheadedness, and productive cough. CXR concerning for PNA; CT suggestive of multifocal bronchopneumonia with perihilar infiltrates R > L.  PMH includes CAD, CHF, anxiety, AICD, gastric bypass Type of Study: Bedside Swallow Evaluation Previous Swallow Assessment: none in chart Diet Prior to this Study: Regular;Thin liquids Temperature Spikes Noted: No Respiratory Status: Room air History of Recent Intubation: No Behavior/Cognition: Alert;Cooperative;Pleasant mood Oral Cavity Assessment: Within Functional Limits Oral Care Completed by SLP: No Oral Cavity - Dentition: Dentures, not available;Edentulous Vision: Functional for self-feeding Self-Feeding Abilities: Able to feed self Patient Positioning: Upright in chair Baseline Vocal Quality: Normal Volitional Cough: Strong Volitional Swallow: Able to elicit    Oral/Motor/Sensory Function Overall Oral Motor/Sensory Function: Other (comment) (question subtle lingual deviation to his R)   Ice Chips Ice chips: Not tested   Thin Liquid Thin Liquid: Within functional limits Presentation: Cup;Self Fed    Nectar Thick Nectar Thick Liquid: Not tested   Honey Thick Honey Thick Liquid: Not tested   Puree Puree: Within functional limits Presentation: Spoon;Self Fed   Solid     Solid: Within functional limits Presentation: Self Fed      Mahala Menghini., M.A. CCC-SLP Acute Rehabilitation Services Pager [phone] Office [phone]  05/09/2020,9:16 AM

## 2020-05-09 NOTE — Progress Notes (Signed)
Patient has refused CPAP for tonight due to situation with his lungs at this time.  No distress noted will continue to monitor.

## 2020-05-09 NOTE — Progress Notes (Signed)
Heart Failure Nurse Navigator Progress Note  Pt from [place], [place]; in [place] visiting his children then subsequently was hospitalized for CP and SOB. Pt plans to return to his home with spouse after hospitalization. Primary Cardiologist is Dr. Ricardo Jericho with [facility]   No needs from HV TOC follow up as pt will return to [place], [place].   Pt requested assistance with his CPAP-states it is "all full of fungus and junk". Deferred to unit RNCM.   Ozella Rocks, RN, BSN Heart Failure Nurse Navigator [phone]

## 2020-05-09 NOTE — Progress Notes (Addendum)
Progress Note  Patient Name: Todd Rivas Date of Encounter: 05/09/2020  [facility] Cardiologist: Follows at [facility]   Subjective   Patient states he is feeling improved today, reports resolved chest pain, still have some SOB and leg edema. He is urinating frequently. He voiced frustration on "dirty CPAP" and felt that was the cause of his pneumonia. He states he has not been doing or walking much since he was feeling sick over the past week.   Inpatient Medications    Scheduled Meds:  aspirin EC  81 mg Oral Daily   atorvastatin  80 mg Oral Daily   chlorhexidine  15 mL Mouth Rinse BID   enoxaparin (LOVENOX) injection  0.5 mg/kg Subcutaneous Daily   FLUoxetine  20 mg Oral Daily   furosemide  40 mg Intravenous Daily   gabapentin  600 mg Oral TID   levothyroxine  200 mcg Oral Q0600   mouth rinse  15 mL Mouth Rinse q12n4p   metoprolol succinate  100 mg Oral Daily   pantoprazole  40 mg Oral Daily   potassium chloride  40 mEq Oral Daily   sacubitril-valsartan  1 tablet Oral BID   spironolactone  25 mg Oral Daily   Continuous Infusions:  azithromycin 500 mg (05/09/20 0931)   cefTRIAXone (ROCEPHIN)  IV 2 g (05/08/20 1740)   PRN Meds: acetaminophen **OR** acetaminophen, albuterol, guaiFENesin, ondansetron **OR** ondansetron (ZOFRAN) IV, oxyCODONE, senna-docusate   Vital Signs    Vitals:   05/08/20 2107 05/08/20 2246 05/09/20 0559 05/09/20 0910  BP: 111/62  100/66 108/79  Pulse: 82 80 91 97  Resp: 19 16 16 19   Temp: 98.3 F (36.8 C)  98.3 F (36.8 C) 97.9 F (36.6 C)  TempSrc: Oral  Oral Oral  SpO2: 99% 98% 99% 98%  Weight:   125.9 kg   Height:        Intake/Output Summary (Last 24 hours) at 05/09/2020 0931 Last data filed at 05/08/2020 1900 Gross per 24 hour  Intake 350 ml  Output 1700 ml  Net -1350 ml   Last 3 Weights 05/09/2020 05/08/2020 05/07/2020  Weight (lbs) 277 lb 9.6 oz 282 lb 9.6 oz 278 lb 9.6 oz  Weight (kg) 125.919 kg 128.187 kg 126.372 kg       Telemetry    SR with rate of 80-90s, multifocal PVCs and intermittent bigeminy PVCs  - Personally Reviewed  ECG    No new tracing today- Personally Reviewed  Physical Exam   GEN: No acute distress. Sitting in chair.   Neck: 9-10 cm JVD noted Cardiac: RRR, no murmurs, rubs, or gallops.  Respiratory: Bilateral crackles on ascultation R>L, on room air, speaks full sentence  GI: Soft, nontender, non-distended  MS: BLE 1- edema; No deformity. Neuro:  Alert and oriented x3, follows commands appropriately, no focal deficit noted  Psych: Normal affect   Labs    High Sensitivity Troponin:   Recent Labs  Lab 05/07/20 0211 05/07/20 0411  TROPONINIHS 6 6      Chemistry Recent Labs  Lab 05/07/20 0237 05/08/20 0247 05/09/20 0236  NA 137 133* 133*  K 3.8 3.7 3.7  CL 103 102 101  CO2  --  23 22  GLUCOSE 105* 87 86  BUN 13 12 9   CREATININE 0.80 0.85 0.81  CALCIUM  --  8.7* 9.1  GFRNONAA  --  >60 >60  ANIONGAP  --  8 10    Magnesium  Date Value Ref Range Status  05/08/2020 2.0  1.7 - 2.4 mg/dL Final    Comment:    Performed at [facility], [address], [place], [place] [address]   No results found for: [facility]  Hematology Recent Labs  Lab 05/07/20 0211 05/07/20 0237 05/08/20 0247 05/09/20 0236  WBC 11.0*  --  13.5* 10.3  RBC 4.04*  --  3.88* 4.03*  HGB 12.3* 12.6* 11.7* 12.2*  HCT 36.9* 37.0* 35.3* 36.3*  MCV 91.3  --  91.0 90.1  MCH 30.4  --  30.2 30.3  MCHC 33.3  --  33.1 33.6  RDW 14.8  --  15.1 15.1  PLT 203  --  183 185    BNP Recent Labs  Lab 05/07/20 0223  BNP 1,095.8*    No results found for: CHOL, HDL, LDLCALC, LDLDIRECT, TRIG, CHOLHDL  DDimer No results for input(s): DDIMER in the last 168 hours.   Radiology    DG CHEST PORT 1 VIEW  Result Date: 05/09/2020 CLINICAL DATA:  Shortness of breath. EXAM: PORTABLE CHEST 1 VIEW COMPARISON:  CT 05/07/2020.  Chest x-ray 05/07/2020. FINDINGS: Cardiac pacer in stable position.  Cardiomegaly with mild pulmonary venous congestion. Persistent bilateral pulmonary infiltrates/edema with progression in the right mid lung. No pleural effusion or pneumothorax. IMPRESSION: 1. Cardiac pacer stable position. Cardiomegaly with mild pulmonary venous congestion. 2. Persistent bilateral pulmonary infiltrates/edema with progression in the right mid lung. These findings are best demonstrated by prior CT. Electronically Signed   By: Maisie Fus  Register   On: 05/09/2020 06:43   ECHOCARDIOGRAM COMPLETE  Result Date: 05/08/2020    ECHOCARDIOGRAM REPORT   Patient Name:   Todd Rivas Date of Exam: 05/08/2020 Medical Rec #:  [id number]     Height:       72.0 in Accession #:    [id number]    Weight:       282.6 lb Date of Birth:  1960/01/17     BSA:          2.468 m Patient Age:    59 years      BP:           110/70 mmHg Patient Gender: M             HR:           95 bpm. Exam Location:  Inpatient Procedure: 2D Echo, Cardiac Doppler and Color Doppler Indications:    CHF-Acute Diastolic  History:        Patient has no prior history of Echocardiogram examinations.                 CHF, CAD, Defibrillator, Signs/Symptoms:Chest Pain and Shortness                 of Breath; Risk Factors:Sleep Apnea. Hypothyroidism.  Sonographer:    Ross Ludwig RDCS (AE) Referring Phys: [id number] CAROLE N HALL IMPRESSIONS  1. Left ventricular ejection fraction, by estimation, is 30 to 35%. The left ventricle has moderately decreased function. The left ventricle demonstrates global hypokinesis. There is mild concentric left ventricular hypertrophy. Left ventricular diastolic parameters are consistent with Grade I diastolic dysfunction (impaired relaxation). Elevated left ventricular end-diastolic pressure.  2. Right ventricular systolic function is moderately reduced. The right ventricular size is normal. There is moderately elevated pulmonary artery systolic pressure.  3. Left atrial size was severely dilated.  4. The mitral valve is  normal in structure. Mild to moderate mitral valve regurgitation. No evidence of mitral stenosis.  5. The aortic valve is tricuspid.  There is mild calcification of the aortic valve. There is mild thickening of the aortic valve. Aortic valve regurgitation is not visualized. No aortic stenosis is present.  6. Aortic dilatation noted. There is mild dilatation of the ascending aorta, measuring 38 mm.  7. The inferior vena cava is dilated in size with >50% respiratory variability, suggesting right atrial pressure of 8 mmHg. FINDINGS  Left Ventricle: Left ventricular ejection fraction, by estimation, is 30 to 35%. The left ventricle has moderately decreased function. The left ventricle demonstrates global hypokinesis. The left ventricular internal cavity size was normal in size. There is mild concentric left ventricular hypertrophy. Left ventricular diastolic parameters are consistent with Grade I diastolic dysfunction (impaired relaxation). Elevated left ventricular end-diastolic pressure. Right Ventricle: The right ventricular size is normal. No increase in right ventricular wall thickness. Right ventricular systolic function is moderately reduced. There is moderately elevated pulmonary artery systolic pressure. The tricuspid regurgitant velocity is 3.15 m/s, and with an assumed right atrial pressure of 8 mmHg, the estimated right ventricular systolic pressure is 47.7 mmHg. Left Atrium: Left atrial size was severely dilated. Right Atrium: Right atrial size was normal in size. Pericardium: There is no evidence of pericardial effusion. Mitral Valve: The mitral valve is normal in structure. Mild to moderate mitral valve regurgitation. No evidence of mitral valve stenosis. Tricuspid Valve: The tricuspid valve is normal in structure. Tricuspid valve regurgitation is mild . No evidence of tricuspid stenosis. Aortic Valve: The aortic valve is tricuspid. There is mild calcification of the aortic valve. There is mild thickening  of the aortic valve. Aortic valve regurgitation is not visualized. No aortic stenosis is present. Aortic valve mean gradient measures 3.0 mmHg. Aortic valve peak gradient measures 4.4 mmHg. Aortic valve area, by VTI measures 2.33 cm. Pulmonic Valve: The pulmonic valve was normal in structure. Pulmonic valve regurgitation is not visualized. No evidence of pulmonic stenosis. Aorta: Aortic dilatation noted. There is mild dilatation of the ascending aorta, measuring 38 mm. Venous: The inferior vena cava is dilated in size with greater than 50% respiratory variability, suggesting right atrial pressure of 8 mmHg. IAS/Shunts: No atrial level shunt detected by color flow Doppler. Additional Comments: A device lead is visualized.  LEFT VENTRICLE PLAX 2D LVIDd:         6.40 cm      Diastology LVIDs:         5.50 cm      LV e' medial:    8.05 cm/s LV PW:         1.20 cm      LV E/e' medial:  15.4 LV IVS:        1.20 cm      LV e' lateral:   12.00 cm/s LVOT diam:     2.20 cm      LV E/e' lateral: 10.3 LV SV:         38 LV SV Index:   15 LVOT Area:     3.80 cm  LV Volumes (MOD) LV vol d, MOD A2C: 231.0 ml LV vol d, MOD A4C: 247.0 ml LV vol s, MOD A2C: 137.0 ml LV vol s, MOD A4C: 161.0 ml LV SV MOD A2C:     94.0 ml LV SV MOD A4C:     247.0 ml LV SV MOD BP:      89.8 ml RIGHT VENTRICLE             IVC RV Basal diam:  3.20 cm  IVC diam: 2.50 cm RV S prime:     13.50 cm/s TAPSE (M-mode): 2.0 cm LEFT ATRIUM              Index       RIGHT ATRIUM           Index LA diam:        5.60 cm  2.27 cm/m  RA Area:     22.70 cm LA Vol (A2C):   141.0 ml 57.14 ml/m RA Volume:   64.50 ml  26.14 ml/m LA Vol (A4C):   122.0 ml 49.44 ml/m LA Biplane Vol: 138.0 ml 55.92 ml/m  AORTIC VALVE AV Area (Vmax):    2.40 cm AV Area (Vmean):   2.36 cm AV Area (VTI):     2.33 cm AV Vmax:           105.00 cm/s AV Vmean:          78.400 cm/s AV VTI:            0.163 m AV Peak Grad:      4.4 mmHg AV Mean Grad:      3.0 mmHg LVOT Vmax:         66.30  cm/s LVOT Vmean:        48.600 cm/s LVOT VTI:          0.100 m LVOT/AV VTI ratio: 0.61  AORTA Ao Root diam: 3.30 cm Ao Asc diam:  3.80 cm MITRAL VALVE                 TRICUSPID VALVE MV Area (PHT): 8.72 cm      TR Peak grad:   39.7 mmHg MV Decel Time: 87 msec       TR Vmax:        315.00 cm/s MR Peak grad:    56.9 mmHg MR Mean grad:    31.0 mmHg   SHUNTS MR Vmax:         377.00 cm/s Systemic VTI:  0.10 m MR Vmean:        258.0 cm/s  Systemic Diam: 2.20 cm MR PISA:         4.02 cm MR PISA Eff ROA: 38 mm MR PISA Radius:  0.80 cm MV E velocity: 124.00 cm/s MV A velocity: 45.20 cm/s MV E/A ratio:  2.74 Chilton Si MD Electronically signed by Chilton Si MD Signature Date/Time: 05/08/2020/5:36:16 PM    Final     Cardiac Studies   Gathered from Care Everywhere  Catheterization History LHC and PCI/DES x 2 to mid and distal LCx and PTCA to OM 02/28/12 (Dr. Wynona Neat) Coronary angiography was performed using multiple angulations are all coronary arteries were well visualized. Left main coronary artery:  Originated from the left coronary cusp.  It was large in size. Mild distal irregularities are noted. Left anterior descending artery:  Originated from the distal left main coronary artery.  Medium in size.  There is a widely patent stent at the first diagonal branch with 25% in stent restenosis present.  The first diagonal branch also has an ostial stent which has 50% ostial in-stent restenosis.  The remainder of LAD and diagonal branch vessels are free of significant disease. Left Circumflex artery: Originated from the distal left main coronary artery and extended in the AV  groove.  Medium in size.  There is a large first marginal branch which has 25% ostial narrowing.  The ongoing left circumflex artery has a 98% stenosis followed by a  medium-sized second marginal branch which is free of significant disease.  The ongoing left circumflex artery has 70% distal disease. Right coronary artery:   Originated from the right coronary cusp. Dominant vessel. Medium in size.  There is 25% proximal stenosis and a 30-40% eccentric mid stenosis of the RCA.  The distal vessel branches into the posterolateral and posterior descending branches which are normal in appearance. Left ventriculography:  Not performed.  AV crossed with guide catheter after PCI for LV pressure measurements.  Conclusions:   Successful drug-eluting stent X 2 to the mid and distal LCx artery Successful PTCA of the first marginal branch artery     R/LHC 11/17/15 (Dr. Clearance CootsHarper) Findings: Hemodynamics: Central aortic pressure: 112/76/91 LV pressure: 111/33 PCWP: 33 PA: 52/29/38 RV: 51/16 RA: 19 Thermodilution cardiac output: 5.80 L/M, index 2.19 Fick cardiac output: 5.59 L/M, index 2.11 Left ventriculography: severe global impairment of LV contraction, LVEF visually estimated at 25% Coronary angiography: The coronary circulation is right dominant Left main: normal LAD: Scattered luminal plaque, patent stent at LAD/D1 CFX: Scattered luminal plaque, patent stent in the proximal/mid segments RCA: Minor luminal plaque  Impression: 1. Severe global impairment of LV function 2. No evidence of significant obstructive CAD 3. Patent LAD, CFX stents 4. Moderate pulmonary hypertension 5. Elevated filling pressures consistent with volume overload    Echo 10/08/18: The left ventricle is moderately dilated. Global hypokinesis of the LV worse in the inferior wall. Diastolic function:abnormal. The left ventricular function is moderately decreased. The estimated left ventricular ejection fraction is 35-40%. Mild to moderate MR.     Echocardiogram 02/28/12:  The left ventricle is mildly dilated with mild concentric left ventricular hypertrophy seen.  Segmental wall motion abnormalities are present as described, consistent with an ischemic cardiomyopathy.  Global left ventricular systolic function is severely reduced.  The  estimated left ventricular ejection fraction is 35 - 40%.  There is impaired diastolic relaxation.  Mild tricuspid regurgitation is seen.  The pulmonary artery pressure could not be estimated from the TR jet due to technical limitations.  There is no evidence of pericardial effusion.    Patient Profile     Algis LimingRicky Pitzer is a 60 y.o. male with PMH of CAD, ischemic cardiomyopathy, chronic combined heart failure, dual chamber MDT ICD in place, PAT, OSA on CPAP, HTN, HLD, hypothyroidism, anxiety, and obesity s/p gastric bypass, cardiology is consulted and following for CHF at the request of Dr. Margo AyeHall.   Assessment & Plan    Acute on chronic systolic and diastolic heart failure Ischemic cardiomyopathy  Mild to Mod MR Pulmonary hypertension MDT ICD in place  - presented with irregular heart rate, chest pain, SOB, cough and lightheadedness - BNP 1095 - Hs trop negative x2 - CTA chest 4/24 without PE, cardiac enlargement, multifocal bronchopneumonia, small bilateral pleural effusions -  Echo 4/25 showed EF 30-35%, LV global hypokinesis, mild LVH, grade IDD, elevated LVEDP, RV systolic function moderately reduced, moderately elevated PA sysotlic pressure, RV systolic pressure is 47.7 mmHg.  LA severely dilated, mild to moderate MR, aortic dilatation 38mm.  - On eplerenone 25mg  daily and PRN Lasix 40mg  at home, compliant, he has been treated with IV lasix 40mg  daily here, adequate response to diuresis,  Net -1350 ml over the past 24 hours, weight down 282 to 277  -  Please continue low sodium diet, monitor strict intake and output, daily renal index, daily weight, and replete electrolyte with goal of K >4 and Mag >2 (Mag level added today) -  continue GDMT with Entresto, Metoprolol XL, and Spironolactone   Multifocal PVCs with intermittent bigeminy  - asymptomatic  - replete electrolyte with goal of K >4 and Mag >2 - continue telemetry  - ICD in place , continue BBlocker  - check TSH and Mag  today   CAP - managed by primary team with IV Azithromycin and CTX  - will DC IV Zofran, no QT prolongation on admission EKG noted   CAD with hx of PCI 2014  - heart cath from 11/2015 no obstructive CAD and patent LAD, CFX stents - Hs trop negative x2 - chest pain has resolved, likely due to pneumonia and CHF - continue medical therapy with ASA, metoprolol XL, statin and Entresto  HTN - BP was low normal, currently on home regimen Lasix, Metoprolol, Spironolactone, and Entresto, monitor tolerance   HLD - check lipid panel tomorrow - continue 80mg  lipitor  OSA - compliant with CPAP  Hypothyroidism - update TSH - on levothyroxine       For questions or updates, please contact [facility] Please consult [email or web address] for contact info under        Signed, , NP  05/09/2020, 9:31 AM    Patient examined chart reviewed Exam with improved breathing clear lungs despite CXR showing persistent infiltrates > RML Output over a liter No active CHF continue lasix D/C home on oral antibiotics per primary service Outpatient f/u with [facility]   [facility] MD [facility]

## 2020-05-09 NOTE — Progress Notes (Signed)
Heart Failure Stewardship Pharmacist Progress Note   PCP: Bufford Buttner, PA-C PCP-Cardiologist: No primary care provider on file.    HPI:  60 yo M with PMH of CAD, CHF, ICD, OSA, HTN, HLD, hypothyroidism, anxiety, and obesity s/p gastric bypass 6 months ago. He presented to the ED on 05/07/20 with chest pain and shortness of breath. He follows with [facility] cardiology. There was a note about him starting Entresto but did not want to fill out patient assistance paperwork. However, he has [place] Medicaid and his insurance covers Entresto completely with $0 copay. He is not following a low-sodium diet and does not weigh himself daily at home. An ECHO was done on 05/08/20 and LVEF was 30-35% (was 34% post stress test in June 2021). He has been admitted for acute on chronic systolic and diastolic heart failure and PNA.   Current HF Medications: Furosemide 40 mg IV daily Metoprolol XL 100 mg daily Entresto 49/51 mg BID Spironolactone 25 mg daily  Prior to admission HF Medications: Furosemide 40 mg daily PRN Metoprolol XL 100 mg daily Eplerenone 25 mg daily  Pertinent Lab Values: . Serum creatinine 0.81, BUN 9, Potassium 3.7, Sodium 133, BNP 1095.8, Magnesium 2.0   Vital Signs: . Weight: 277 lbs (admission weight: 278 lbs) . Blood pressure: 110/70s  . Heart rate: 80s   Medication Assistance / Insurance Benefits Check: Does the patient have prescription insurance?  Yes Type of insurance plan:  [employer]   Outpatient Pharmacy:  Prior to admission outpatient pharmacy: [facility] Is the patient willing to use [facility] TOC pharmacy at discharge? Yes Is the patient willing to transition their outpatient pharmacy to utilize a [facility] outpatient pharmacy?   Pending    Assessment: 1. Acute on chronic systolic CHF (EF 88-41%), due to ICM. NYHA class II symptoms. - Continue furosemide 40 mg IV daily - Continue metoprolol XL 100 mg daily - Continue Entresto 49/51 mg BID - Continue spironolactone 25  mg daily - Consider starting Farxiga 10 mg daily - it appears this was last filled 03/21/20 (started during pharmacy visit on 06/10/19) but no records from [facility] cardiology thus far suggest that this was continued during last visit in February.    Plan: 1) Medication changes recommended at this time: - Start Farxiga 10 mg daily  2) Patient assistance: Sherryll Burger copay $0  3)  Education  - To be completed prior to discharge  Sharen Hones, PharmD, BCPS Heart Failure Stewardship Pharmacist Phone [phone]

## 2020-05-09 NOTE — Progress Notes (Signed)
PROGRESS NOTE  Todd LimingRicky Rivas ZOX:[id number] DOB: [birth year] DOA: 05/07/2020 PCP: Bufford Buttneruriale, Anthony, PA-C  HPI/Recap of past 24 hours: Todd Pridgenis a 60 y.o.malewith medical history significant forchronic combined systolic and diastolic CHF, hypothyroidism, coronary artery disease, OSA on CPAP, hypothyroidism, anxiety, and chronic pain, now presenting to the emergency department for evaluation of shortness of breath, productive cough, chest pain, and lightheadedness. Patient reports 2 to 3 days of shortness of breath, initially with exertion but worsening to the point where he is now dyspneic at rest. Self-reported compliance with CPAP machine with a mask full of mold.  He was told by his son to stop using however continued.  States he has contacted the company but has not yet received a replacement.  Work-up revealed multifocal pneumonia for which she was started on Rocephin and azithromycin and also acute systolic CHF on ongoing IV diuresing.  Seen by cardiology, following.  Due to concern for mold lungs exposure from his CPAP mask, pulmonary was consulted to assist with the management.  05/09/20:  Seen and examined at his bedside.  He reports dyspnea at rest last night with difficulty laying down flat and a feeling of not being able to catch his breath.  Assessment/Plan: Principal Problem:   Multifocal pneumonia Active Problems:   Chest pain   Chronic combined systolic and diastolic CHF (congestive heart failure) (HCC)   CAD (coronary artery disease)   Anxiety   Hypothyroidism   OSA on CPAP  Multifocal pneumonia, POA Presented with shortness of breath, productive cough, chest pain, lightheadedness. Troponin negative x2. No evidence of acute ischemia on 12 EKG. Present.  Leukocytosis and evidence of pulmonary infiltrates on chest x-ray and CT chest. Currently on day #3 of Rocephin and azithromycin, started on admission. Sputum culture obtained on 05/07/2020 reincubated for better  growth.  Possible fungal pneumonitis Self-reported exposure to mold from his CPAP mask Repeated chest x-ray done on 05/09/2020 personally reviewed, showing worsening right-sided pulmonary infiltrates. Pulmonary consulted to assist with the management  Acute on chronic combined diastolic and systolic CHF Presented with elevated BNP greater than 2000, pulmonary edema seen on chest x-ray and on CT chest. Started on IV diuretics, IV Lasix 40 mg daily. Repeat BNP in the morning. Strict I's and O's and daily weight. Net I&O -975 cc  Persistent dyspnea likely secondary to above No evidence of pulmonary embolism on CTA chest Currently not hypoxic with O2 saturation 98% on room air. Mobilize as tolerated Incentive spirometer  Hypothyroidism Continue home Synthroid  OSA CPAP qhs Will need early pulmonary follow-up.  Coronary artery disease Denies any anginal symptoms at time of this visit Continue home aspirin statin beta-blocker. Cardiology following.  Chronic anxiety Continue home Prozac  Chronic pain Continue home regimen, gabapentin and as needed oxycodone.   Code Status: Full code.  Family Communication: Updated his wife via phone on 05/09/2020.  Disposition Plan: Discharge to home on 05/10/2020 or when pulmonary signs off.  Consultants:  Cardiology.  Pulmonology/CCM  Procedures:  2D echo.  Antimicrobials:  Rocephin  Azithromycin.  DVT prophylaxis: Subcu Lovenox daily.  Status is: Inpatient   Dispo:  Patient From: Home  Planned Disposition: Home possibly on 05/10/2020 or when pulmonary signs off.  Medically stable for discharge: No          Objective: Vitals:   05/08/20 2107 05/08/20 2246 05/09/20 0559 05/09/20 0910  BP: 111/62  100/66 108/79  Pulse: 82 80 91 97  Resp: 19 16 16 19   Temp: 98.3 F (36.8  C)  98.3 F (36.8 C) 97.9 F (36.6 C)  TempSrc: Oral  Oral Oral  SpO2: 99% 98% 99% 98%  Weight:   125.9 kg   Height:         Intake/Output Summary (Last 24 hours) at 05/09/2020 1533 Last data filed at 05/08/2020 1900 Gross per 24 hour  Intake 100 ml  Output 600 ml  Net -500 ml   Filed Weights   05/07/20 0602 05/08/20 0447 05/09/20 0559  Weight: 126.4 kg 128.2 kg 125.9 kg    Exam:  . General: 60 y.o. year-old male well-developed well-nourished in no acute distress.  He is alert and oriented x3.   . Cardiovascular: Regular rate and rhythm no rubs or gallops.  No JVD or thyromegaly noted. Marland Kitchen Respiratory: Rales noted bilaterally at bases.  No wheezing noted.  Good inspiratory effort. . Abdomen: Obese nontender normal bowel sounds present.   . Musculoskeletal: Trace lower extremity edema bilaterally. . Skin: No ulcerative lesions noted.   Marland Kitchen Psychiatry: Mood is appropriate for condition and setting.   Data Reviewed: CBC: Recent Labs  Lab 05/07/20 0211 05/07/20 0237 05/08/20 0247 05/09/20 0236  WBC 11.0*  --  13.5* 10.3  NEUTROABS 9.2*  --   --   --   HGB 12.3* 12.6* 11.7* 12.2*  HCT 36.9* 37.0* 35.3* 36.3*  MCV 91.3  --  91.0 90.1  PLT 203  --  183 185   Basic Metabolic Panel: Recent Labs  Lab 05/07/20 0237 05/08/20 0247 05/08/20 1532 05/09/20 0236 05/09/20 1007  NA 137 133*  --  133*  --   K 3.8 3.7  --  3.7  --   CL 103 102  --  101  --   CO2  --  23  --  22  --   GLUCOSE 105* 87  --  86  --   BUN 13 12  --  9  --   CREATININE 0.80 0.85  --  0.81  --   CALCIUM  --  8.7*  --  9.1  --   MG  --   --  2.0  --  1.9   GFR: Estimated Creatinine Clearance: 134.6 mL/min (by C-G formula based on SCr of 0.81 mg/dL). Liver Function Tests: No results for input(s): AST, ALT, ALKPHOS, BILITOT, PROT, ALBUMIN in the last 168 hours. No results for input(s): LIPASE, AMYLASE in the last 168 hours. No results for input(s): AMMONIA in the last 168 hours. Coagulation Profile: No results for input(s): INR, PROTIME in the last 168 hours. Cardiac Enzymes: No results for input(s): CKTOTAL, CKMB,  CKMBINDEX, TROPONINI in the last 168 hours. BNP (last 3 results) No results for input(s): PROBNP in the last 8760 hours. HbA1C: No results for input(s): HGBA1C in the last 72 hours. CBG: No results for input(s): GLUCAP in the last 168 hours. Lipid Profile: No results for input(s): CHOL, HDL, LDLCALC, TRIG, CHOLHDL, LDLDIRECT in the last 72 hours. Thyroid Function Tests: No results for input(s): TSH, T4TOTAL, FREET4, T3FREE, THYROIDAB in the last 72 hours. Anemia Panel: No results for input(s): VITAMINB12, FOLATE, FERRITIN, TIBC, IRON, RETICCTPCT in the last 72 hours. Urine analysis: No results found for: COLORURINE, APPEARANCEUR, LABSPEC, PHURINE, GLUCOSEU, HGBUR, BILIRUBINUR, KETONESUR, PROTEINUR, UROBILINOGEN, NITRITE, LEUKOCYTESUR Sepsis Labs: @LABRCNTIP (procalcitonin:4,lacticidven:4)  ) Recent Results (from the past 240 hour(s))  Resp Panel by RT-PCR (Flu A&B, Covid) Nasopharyngeal Swab     Status: None   Collection Time: 05/07/20  2:41 AM   Specimen: Nasopharyngeal Swab;  Nasopharyngeal(NP) swabs in vial transport medium  Result Value Ref Range Status   SARS Coronavirus 2 by RT PCR NEGATIVE NEGATIVE Final    Comment: (NOTE) SARS-CoV-2 target nucleic acids are NOT DETECTED.  The SARS-CoV-2 RNA is generally detectable in upper respiratory specimens during the acute phase of infection. The lowest concentration of SARS-CoV-2 viral copies this assay can detect is 138 copies/mL. A negative result does not preclude SARS-Cov-2 infection and should not be used as the sole basis for treatment or other patient management decisions. A negative result may occur with  improper specimen collection/handling, submission of specimen other than nasopharyngeal swab, presence of viral mutation(s) within the areas targeted by this assay, and inadequate number of viral copies(<138 copies/mL). A negative result must be combined with clinical observations, patient history, and  epidemiological information. The expected result is Negative.  Fact Sheet for Patients:  BloggerCourse.com  Fact Sheet for Healthcare Providers:  SeriousBroker.it  This test is no t yet approved or cleared by the [place] FDA and  has been authorized for detection and/or diagnosis of SARS-CoV-2 by FDA under an Emergency Use Authorization (EUA). This EUA will remain  in effect (meaning this test can be used) for the duration of the COVID-19 declaration under Section 564(b)(1) of the Act, 21 U.S.C.section 360bbb-3(b)(1), unless the authorization is terminated  or revoked sooner.       Influenza A by PCR NEGATIVE NEGATIVE Final   Influenza B by PCR NEGATIVE NEGATIVE Final    Comment: (NOTE) The Xpert Xpress SARS-CoV-2/FLU/RSV plus assay is intended as an aid in the diagnosis of influenza from Nasopharyngeal swab specimens and should not be used as a sole basis for treatment. Nasal washings and aspirates are unacceptable for Xpert Xpress SARS-CoV-2/FLU/RSV testing.  Fact Sheet for Patients: BloggerCourse.com  Fact Sheet for Healthcare Providers: SeriousBroker.it  This test is not yet approved or cleared by the [place] FDA and has been authorized for detection and/or diagnosis of SARS-CoV-2 by FDA under an Emergency Use Authorization (EUA). This EUA will remain in effect (meaning this test can be used) for the duration of the COVID-19 declaration under Section 564(b)(1) of the Act, 21 U.S.C. section 360bbb-3(b)(1), unless the authorization is terminated or revoked.  Performed at [facility], [address], [place], [place] [address]   Blood culture (routine x 2)     Status: None (Preliminary result)   Collection Time: 05/07/20  3:24 AM   Specimen: BLOOD RIGHT HAND  Result Value Ref Range Status   Specimen Description BLOOD RIGHT HAND  Final   Special  Requests   Final    BOTTLES DRAWN AEROBIC AND ANAEROBIC Blood Culture adequate volume   Culture   Final    NO GROWTH 2 DAYS Performed at [facility], [address] [place], [place] [address]    Report Status PENDING  Incomplete  Blood culture (routine x 2)     Status: None (Preliminary result)   Collection Time: 05/07/20  3:30 AM   Specimen: BLOOD LEFT HAND  Result Value Ref Range Status   Specimen Description BLOOD LEFT HAND  Final   Special Requests   Final    BOTTLES DRAWN AEROBIC AND ANAEROBIC Blood Culture adequate volume   Culture   Final    NO GROWTH 2 DAYS Performed at [facility], [address], [place], [place] [address]    Report Status PENDING  Incomplete  Culture, sputum-assessment     Status: None  Collection Time: 05/07/20  3:25 PM   Specimen: Expectorated Sputum  Result Value Ref Range Status   Specimen Description EXPECTORATED SPUTUM  Final   Special Requests NONE  Final   Sputum evaluation   Final    THIS SPECIMEN IS ACCEPTABLE FOR SPUTUM CULTURE Performed at [facility], [address] [place], [place] [address]    Report Status 05/08/2020 FINAL  Final  Culture, Respiratory w Gram Stain     Status: None (Preliminary result)   Collection Time: 05/07/20  3:25 PM  Result Value Ref Range Status   Specimen Description EXPECTORATED SPUTUM  Final   Special Requests NONE Reflexed from [id number]  Final   Gram Stain   Final    ABUNDANT WBC PRESENT, PREDOMINANTLY PMN FEW GRAM POSITIVE COCCI IN CLUSTERS RARE GRAM POSITIVE RODS RARE GRAM NEGATIVE RODS    Culture   Final    CULTURE REINCUBATED FOR BETTER GROWTH Performed at [facility], [address], [place], [place] [address]    Report Status PENDING  Incomplete      Studies: DG CHEST PORT 1 VIEW  Result Date: 05/09/2020 CLINICAL DATA:  Shortness of breath. EXAM: PORTABLE CHEST 1 VIEW COMPARISON:  CT 05/07/2020.  Chest x-ray 05/07/2020. FINDINGS: Cardiac pacer in stable  position. Cardiomegaly with mild pulmonary venous congestion. Persistent bilateral pulmonary infiltrates/edema with progression in the right mid lung. No pleural effusion or pneumothorax. IMPRESSION: 1. Cardiac pacer stable position. Cardiomegaly with mild pulmonary venous congestion. 2. Persistent bilateral pulmonary infiltrates/edema with progression in the right mid lung. These findings are best demonstrated by prior CT. Electronically Signed   By: Maisie Fus  Register   On: 05/09/2020 06:43    Scheduled Meds: . aspirin EC  81 mg Oral Daily  . atorvastatin  80 mg Oral Daily  . chlorhexidine  15 mL Mouth Rinse BID  . enoxaparin (LOVENOX) injection  0.5 mg/kg Subcutaneous Daily  . FLUoxetine  20 mg Oral Daily  . furosemide  40 mg Intravenous Daily  . gabapentin  600 mg Oral TID  . levothyroxine  200 mcg Oral Q0600  . mouth rinse  15 mL Mouth Rinse q12n4p  . melatonin  3 mg Oral QHS  . metoprolol succinate  100 mg Oral Daily  . pantoprazole  40 mg Oral Daily  . potassium chloride  40 mEq Oral Daily  . sacubitril-valsartan  1 tablet Oral BID  . spironolactone  25 mg Oral Daily    Continuous Infusions: . azithromycin 500 mg (05/09/20 0931)  . cefTRIAXone (ROCEPHIN)  IV 2 g (05/08/20 1740)     LOS: 2 days     Darlin Drop, MD [facility] Pager [phone]  If 7PM-7AM, please contact night-coverage [email or web address] Password [facility] 05/09/2020, 3:33 PM

## 2020-05-09 NOTE — Plan of Care (Signed)
  Problem: Pain Managment: Goal: General experience of comfort will improve Outcome: Progressing   Problem: Safety: Goal: Ability to remain free from injury will improve Outcome: Progressing   

## 2020-05-09 NOTE — Progress Notes (Signed)
4/26- referral received regarding cpap at home- pt is active with [facility] for home cpap- in speaking with Morrie Sheldon from [place] pt has been approved for new Cpap supplies, etc- however since he has been here visiting the [facility] from his home supplier has not been able to get in touch with him regarding delivery. Pt needs to call them regarding his plans and delivery- TOC will provide pt with contact info to call [facility] [phone] opt 1)- he can ask to speak with Tiffany.

## 2020-05-09 NOTE — Consult Note (Signed)
NAME:  Todd Rivas, MRN:  [id number], DOB:  1960-06-19, LOS: 2 ADMISSION DATE:  05/07/2020, CONSULTATION DATE:  4/26 REFERRING MD:  Dr. Margo Aye, CHIEF COMPLAINT:  Chest pain  History of Present Illness:  60 y/o M who presented to [facility] ER on 4/24 with reports of feeling "like my heart isn't right" and SOB.    The patient was reportedly in town for his grandson's football game.  He is followed at [facility] near [facility] for CAD (on entresto, aldactone, lasix and beta blocker).  Has a hx of  CAD s/p ICD-CRT.  He has known OSA and faithfully wears his CPAP.  He is uncertain when he had his sleep study.  Since that time, he had a gastric bypass and has lost 100lbs.  He and his wife note mold/mildew in the tubing and he has not been cleaning this regularly. They are wondering how they can get a new machine.     Since admission he has been diuresed with IV lasix and now feels better. He had CTPE study on admission that shows no PE, bilateral pleural effusions, and nodular opacities. He is afebrile and on room air.   Pertinent  Medical History  OSA  Hypothyroidism  CAD s/p AICD CHF - LVEF 35%,   Anxiety  Gastric Bypass   Significant Hospital Events: Including procedures, antibiotic start and stop dates in addition to other pertinent events   . 4/24 Admit  . 4/26 PCCM consulted for evaluation of infiltrates   Interim History / Subjective:  Breathing is back to baseline.  Objective   Blood pressure 108/79, pulse 97, temperature 97.9 F (36.6 C), temperature source Oral, resp. rate 19, height 6' (1.829 m), weight 125.9 kg, SpO2 98 %.        Intake/Output Summary (Last 24 hours) at 05/09/2020 1554 Last data filed at 05/08/2020 1900 Gross per 24 hour  Intake 100 ml  Output 600 ml  Net -500 ml   Filed Weights   05/07/20 0602 05/08/20 0447 05/09/20 0559  Weight: 126.4 kg 128.2 kg 125.9 kg    Examination:  Gen:       No acute distress, HEENT:  EOMI, sclera anicteric, mmm, poor  dentition Lungs:    Clear to auscultation bilaterally; normal respiratory effort, on room air, no wheezes or crackles CV:         Tachycardic, regular, frequent PVCs Abd:      + bowel sounds; soft, non-tender; no palpable masses, no distension Ext:    No edema; adequate peripheral perfusion Skin:       Warm and dry; no rash Neuro:    alert and oriented x 3 Psych:    normal mood and affect    Labs/imaging that I have personally reviewed  (right click and "Reselect all SmartList Selections" daily)  VSS, afebrile  On RA  Medication list  I/O - 1.7L UOP, -1.3L in last 24 hours BMP - Na 133 BNP - 1,095 PCT - 0.18  CBC - WBC 10.3, Hgb 12.2, platelets 185  HIV, COVID, influenza, U. Strep antigen negative  Sputum culture  > abundant WBC on GS  Blood Cultures >  ECHO with LVEF 30-35%, LV with global hypokinesis, grade I diastolic dysfunction, RV systolic function moderately reduced, moderately elevated pulmonary artery pressure, LA severely dilated.  Mild to moderate MV regurgitation  EKG with NSR, IVCD CXR - right perihilar infiltrate  CTA Chest PE Protocol > negative for PE, cardiomegaly, nodular infiltrates R>L, small effusions, postoperative changes  c/w gastric bypass    CTPE - negative for PE, there are trace bilateral pleural effusions, nodular bibasilar opacities differential includes pulmonary edema, less likely infection or aspiration Procalcitonin low BNP 1095 Cr 0.8 Na 133  Resolved Hospital Problem list     Assessment & Plan:   Abnormal CT Chest - Bilateral Nodular Pulmonary Infiltrates  Shortness of breath OSA on CPAP   Mr. Simien has shortness of breath and chest pain which are now resolved. He has received lasix for diuresis.   Reasonable to treat for 5 days total for CAP. Ceftriaxone/azithromycin.  Needs repeat CT Chest with PCP in 6-8 weeks to ensure resolution of these nodular opacities.  If persistent would recommend pulmonary consultation.   It sounds like  he needs a new CPAP machine due to age, mold/mildew, and  - will ask transitions of care to help facilitate a new machine - it sounds like they have already been made aware. He will likely need a sleep study again anyway since he has lost over 100 lbs after gastric bypass surgery.   Thank you for this consult. We will see as needed. Please don't hesitate to contact us if we can be of further assistance.   Durel Salts, MD Pulmonary and Critical Care Medicine [facility]   Best practice (right click and "Reselect all SmartList Selections" daily)  Per Primary Team  Labs   CBC: Recent Labs  Lab 05/07/20 0211 05/07/20 0237 05/08/20 0247 05/09/20 0236  WBC 11.0*  --  13.5* 10.3  NEUTROABS 9.2*  --   --   --   HGB 12.3* 12.6* 11.7* 12.2*  HCT 36.9* 37.0* 35.3* 36.3*  MCV 91.3  --  91.0 90.1  PLT 203  --  183 185    Basic Metabolic Panel: Recent Labs  Lab 05/07/20 0237 05/08/20 0247 05/08/20 1532 05/09/20 0236 05/09/20 1007  NA 137 133*  --  133*  --   K 3.8 3.7  --  3.7  --   CL 103 102  --  101  --   CO2  --  23  --  22  --   GLUCOSE 105* 87  --  86  --   BUN 13 12  --  9  --   CREATININE 0.80 0.85  --  0.81  --   CALCIUM  --  8.7*  --  9.1  --   MG  --   --  2.0  --  1.9   GFR: Estimated Creatinine Clearance: 134.6 mL/min (by C-G formula based on SCr of 0.81 mg/dL). Recent Labs  Lab 05/07/20 0211 05/08/20 0247 05/09/20 0236  PROCALCITON  --  0.16 0.18  WBC 11.0* 13.5* 10.3    Liver Function Tests: No results for input(s): AST, ALT, ALKPHOS, BILITOT, PROT, ALBUMIN in the last 168 hours. No results for input(s): LIPASE, AMYLASE in the last 168 hours. No results for input(s): AMMONIA in the last 168 hours.  ABG    Component Value Date/Time   TCO2 21 (L) 05/07/2020 0237     Coagulation Profile: No results for input(s): INR, PROTIME in the last 168 hours.  Cardiac Enzymes: No results for input(s): CKTOTAL, CKMB, CKMBINDEX, TROPONINI in the last  168 hours.  HbA1C: No results found for: HGBA1C  CBG: No results for input(s): GLUCAP in the last 168 hours.  Review of Systems: Positives in [place]   Gen: Denies fever, chills, weight change, fatigue, night sweats HEENT: Denies blurred vision, double vision, hearing  loss, tinnitus, sinus congestion, rhinorrhea, sore throat, neck stiffness, dysphagia PULM: shortness of breath, Denies cough, sputum production, hemoptysis, wheezing CV: chest pain,Denies edema, orthopnea, paroxysmal nocturnal dyspnea, palpitations GI: Denies abdominal pain, nausea, vomiting, diarrhea, hematochezia, melena, constipation, change in bowel habits GU: Denies dysuria, hematuria, polyuria, oliguria, urethral discharge Endocrine: Denies hot or cold intolerance, polyuria, polyphagia or appetite change Derm: Denies rash, dry skin, scaling or peeling skin change Heme: Denies easy bruising, bleeding, bleeding gums Neuro: Denies headache, numbness, weakness, slurred speech, loss of memory or consciousness  Past Medical History:  He,  has a past medical history of AICD (automatic cardioverter/defibrillator) present, Anxiety, CHF (congestive heart failure) (HCC), Coronary artery disease, Hypothyroidism, and Sleep apnea.   Surgical History:   Past Surgical History:  Procedure Laterality Date  . CARDIAC CATHETERIZATION    . GASTRIC BYPASS       Social History:   reports that he has never smoked. He has never used smokeless tobacco. He reports previous alcohol use. He reports previous drug use.   Family History:  His family history includes Arrhythmia in his mother.   Allergies Allergies  Allergen Reactions  . Lisinopril     Other reaction(s): Cough Other reaction(s): Cough      Home Medications  Prior to Admission medications   Medication Sig Start Date End Date Taking? Authorizing Provider  albuterol (VENTOLIN HFA) 108 (90 Base) MCG/ACT inhaler Inhale 2 puffs into the lungs every 6 (six) hours as needed  for shortness of breath.   Yes [provider]  aspirin 81 MG EC tablet Take 81 mg by mouth daily. 03/01/12  Yes [provider]  atorvastatin (LIPITOR) 80 MG tablet Take 80 mg by mouth every evening. 04/13/20  Yes [provider]  ENTRESTO 49-51 MG Take 1 tablet by mouth 2 (two) times daily. 03/21/20  Yes [provider]  eplerenone (INSPRA) 25 MG tablet Take 25 mg by mouth daily. 04/20/20  Yes [provider]  FLUoxetine (PROZAC) 20 MG capsule Take 20 mg by mouth daily. 05/03/20  Yes [provider]  furosemide (LASIX) 40 MG tablet Take 40 mg by mouth daily as needed for fluid. 10/17/17  Yes [provider]  gabapentin (NEURONTIN) 600 MG tablet Take 600-1,200 mg by mouth See admin instructions. Take 1 tablet by mouth in the morning, and 2 tablets by mouth in the evening   Yes [provider]  levothyroxine (SYNTHROID) 200 MCG tablet Take 200 mcg by mouth every morning. 06/30/17  Yes [provider]  magnesium oxide (MAG-OX) 400 (241.3 Mg) MG tablet Take 400 mg by mouth daily. 03/02/20  Yes [provider]  metoprolol succinate (TOPROL-XL) 100 MG 24 hr tablet Take 100 mg by mouth at bedtime. 10/11/15  Yes [provider]  nitroGLYCERIN (NITROSTAT) 0.4 MG SL tablet Place 0.4 mg under the tongue every 5 (five) minutes x 3 doses as needed for chest pain. 12/22/19  Yes [provider]  OVER THE COUNTER MEDICATION Take 1 capsule by mouth 2 (two) times daily. Super Beta Prostate   Yes [provider]  Oxycodone HCl 10 MG TABS Take 10 mg by mouth 2 (two) times daily as needed for pain. 04/18/20  Yes [provider]  pantoprazole (PROTONIX) 40 MG tablet Take 40 mg by mouth daily.   Yes [provider]  sildenafil (VIAGRA) 100 MG tablet Take 100 mg by mouth daily as needed for erectile dysfunction.   Yes [provider]

## 2020-05-10 LAB — CBC
HCT: 39.6 % (ref 39.0–52.0)
Hemoglobin: 13 g/dL (ref 13.0–17.0)
MCH: 29.8 pg (ref 26.0–34.0)
MCHC: 32.8 g/dL (ref 30.0–36.0)
MCV: 90.8 fL (ref 80.0–100.0)
Platelets: 217 10*3/uL (ref 150–400)
RBC: 4.36 MIL/uL (ref 4.22–5.81)
RDW: 15.1 % (ref 11.5–15.5)
WBC: 10.7 10*3/uL — ABNORMAL HIGH (ref 4.0–10.5)
nRBC: 0 % (ref 0.0–0.2)

## 2020-05-10 LAB — BASIC METABOLIC PANEL
Anion gap: 12 (ref 5–15)
BUN: 9 mg/dL (ref 6–20)
CO2: 24 mmol/L (ref 22–32)
Calcium: 9.6 mg/dL (ref 8.9–10.3)
Chloride: 101 mmol/L (ref 98–111)
Creatinine, Ser: 0.83 mg/dL (ref 0.61–1.24)
GFR, Estimated: >60 mL/min (ref >60)
Glucose, Bld: 88 mg/dL (ref 70–99)
Potassium: 3.7 mmol/L (ref 3.5–5.1)
Sodium: 137 mmol/L (ref 135–145)

## 2020-05-10 LAB — LIPID PANEL
Cholesterol: 122 mg/dL (ref 0–200)
HDL: 34 mg/dL — ABNORMAL LOW (ref >40)
LDL Cholesterol: 77 mg/dL (ref 0–99)
Total CHOL/HDL Ratio: 3.6 RATIO
Triglycerides: 53 mg/dL (ref <150)
VLDL: 11 mg/dL (ref 0–40)

## 2020-05-10 LAB — BRAIN NATRIURETIC PEPTIDE: B Natriuretic Peptide: 1224.1 pg/mL — ABNORMAL HIGH (ref 0.0–100.0)

## 2020-05-10 LAB — CULTURE, RESPIRATORY W GRAM STAIN: Culture: NORMAL

## 2020-05-10 LAB — TSH: TSH: 1.992 u[IU]/mL (ref 0.350–4.500)

## 2020-05-10 MED ORDER — MAGNESIUM SULFATE IN D5W 1-5 GM/100ML-% IV SOLN
1.0000 g | Freq: Once | INTRAVENOUS | Status: AC
  Administered 2020-05-10: 1 g via INTRAVENOUS
  Filled 2020-05-10: qty 100

## 2020-05-10 MED ORDER — DAPAGLIFLOZIN PROPANEDIOL 10 MG PO TABS
10.0000 mg | ORAL_TABLET | Freq: Every day | ORAL | Status: DC
  Administered 2020-05-10: 10 mg via ORAL
  Filled 2020-05-10 (×2): qty 1

## 2020-05-10 NOTE — Plan of Care (Signed)
Pharmacy noted patient is also taking farxiga at home,ordered today for GDMT.

## 2020-05-10 NOTE — Progress Notes (Signed)
[facility]  PROGRESS NOTE  Todd Rivas KTG:[id number] DOB: 10-05-1960 DOA: 05/07/2020 PCP: Bufford Buttner, PA-C   Brief HPI:   60 year old male with medical history of chronic combined systolic and diastolic CHF, hypothyroidism, CAD some OSA on CPAP, hypothyroidism, anxiety, chronic pain came to ED for evaluation of shortness of breath, productive cough, chest pain and lightheadedness.  Patient reported 2 to 3 days of shortness of breath initially with exertion, but worsening to the point where he is now dyspneic at rest.  Self-reported compliance with CPAP machine and mask full of mold.  He was told by son not to use it but patient continue to use it.  States that he contacted the company but has not yet received a replacement. Work-up revealed multifocal pneumonia for which she was started on Rocephin and Zithromax.  Also found to be in acute systolic CHF, started on IV diuresis.     Subjective   Patient seen and examined, denies shortness of breath.   Assessment/Plan:     1. Multifocal pneumonia, POA-patient found to have multifocal pneumonia with evidence of pulmonary infiltrates on chest x-ray and CT chest.  Started on Rocephin and Zithromax.  Sputum culture only grew normal flora.  Pulmonology was consulted and recommended to continue treatment for 5 days of IV antibiotics. 2. ?  Fungal pneumonitis-self-reported exposure to mold from CPAP mask.  Pulmonology has recommended to get new CPAP machine. 3. Persistent dyspnea-likely due to above, pulmonology also recommending sleep study as outpatient. 4. Hypothyroidism-continue Synthroid 5. Acute on chronic combined systolic and diastolic CHF-BNP was elevated greater than 2000, pulmonary edema seen on chest x-ray and CT chest.  Patient started on IV diuresis with Lasix. 6. CPAP-continue CPAP nightly 7. CAD-no chest pain, continue aspirin, statin, beta-blocker. 8. Chronic anxiety-continue Prozac 9. Chronic pain syndrome-continue  gabapentin, as needed oxycodone   Scheduled medications:   . aspirin EC  81 mg Oral Daily  . atorvastatin  80 mg Oral Daily  . chlorhexidine  15 mL Mouth Rinse BID  . dapagliflozin propanediol  10 mg Oral Daily  . enoxaparin (LOVENOX) injection  0.5 mg/kg Subcutaneous Daily  . FLUoxetine  20 mg Oral Daily  . furosemide  40 mg Intravenous Daily  . gabapentin  600 mg Oral TID  . levothyroxine  200 mcg Oral Q0600  . mouth rinse  15 mL Mouth Rinse q12n4p  . melatonin  3 mg Oral QHS  . metoprolol succinate  100 mg Oral Daily  . pantoprazole  40 mg Oral Daily  . potassium chloride  40 mEq Oral Daily  . sacubitril-valsartan  1 tablet Oral BID  . spironolactone  25 mg Oral Daily         Data Reviewed:   CBG:  No results for input(s): GLUCAP in the last 168 hours.  SpO2: 98 %    Vitals:   05/09/20 0910 05/09/20 2121 05/10/20 0502 05/10/20 1353  BP: 108/79 113/76 118/71 111/76  Pulse: 97 91 86 90  Resp: 19 18 19 16   Temp: 97.9 F (36.6 C) 98.2 F (36.8 C) 98.3 F (36.8 C) 98.3 F (36.8 C)  TempSrc: Oral Oral Oral Oral  SpO2: 98% 98%    Weight:   124.8 kg   Height:         Intake/Output Summary (Last 24 hours) at 05/10/2020 1933 Last data filed at 05/10/2020 1858 Gross per 24 hour  Intake --  Output 1025 ml  Net -1025 ml    04/26 0701 - 04/27  1900 In: 347  Out: 2575 [Urine:2575]  Filed Weights   05/08/20 0447 05/09/20 0559 05/10/20 0502  Weight: 128.2 kg 125.9 kg 124.8 kg    CBC:  Recent Labs  Lab 05/07/20 0211 05/07/20 0237 05/08/20 0247 05/09/20 0236 05/10/20 0356  WBC 11.0*  --  13.5* 10.3 10.7*  HGB 12.3* 12.6* 11.7* 12.2* 13.0  HCT 36.9* 37.0* 35.3* 36.3* 39.6  PLT 203  --  183 185 217  MCV 91.3  --  91.0 90.1 90.8  MCH 30.4  --  30.2 30.3 29.8  MCHC 33.3  --  33.1 33.6 32.8  RDW 14.8  --  15.1 15.1 15.1  LYMPHSABS 0.7  --   --   --   --   MONOABS 0.9  --   --   --   --   EOSABS 0.0  --   --   --   --   BASOSABS 0.1  --   --   --   --      Complete metabolic panel:  Recent Labs  Lab 05/07/20 0223 05/07/20 0237 05/08/20 0247 05/08/20 1532 05/09/20 0236 05/09/20 1007 05/10/20 0356  NA  --  137 133*  --  133*  --  137  K  --  3.8 3.7  --  3.7  --  3.7  CL  --  103 102  --  101  --  101  CO2  --   --  23  --  22  --  24  GLUCOSE  --  105* 87  --  86  --  88  BUN  --  13 12  --  9  --  9  CREATININE  --  0.80 0.85  --  0.81  --  0.83  CALCIUM  --   --  8.7*  --  9.1  --  9.6  MG  --   --   --  2.0  --  1.9  --   PROCALCITON  --   --  0.16  --  0.18  --   --   TSH  --   --   --   --   --   --  1.992  BNP 1,095.8*  --   --   --   --   --  1,224.1*    No results for input(s): LIPASE, AMYLASE in the last 168 hours.  Recent Labs  Lab 05/07/20 0223 05/07/20 0241 05/08/20 0247 05/09/20 0236 05/10/20 0356  BNP 1,095.8*  --   --   --  1,224.1*  PROCALCITON  --   --  0.16 0.18  --   SARSCOV2NAA  --  NEGATIVE  --   --   --     ------------------------------------------------------------------------------------------------------------------ Recent Labs    05/10/20 0356  CHOL 122  HDL 34*  LDLCALC 77  TRIG 53  CHOLHDL 3.6    No results found for: HGBA1C ------------------------------------------------------------------------------------------------------------------ Recent Labs    05/10/20 0356  TSH 1.992   ------------------------------------------------------------------------------------------------------------------ No results for input(s): VITAMINB12, FOLATE, FERRITIN, TIBC, IRON, RETICCTPCT in the last 72 hours.  Coagulation profile  No results for input(s): INR, PROTIME in the last 168 hours.  No results for input(s): DDIMER in the last 72 hours.  Cardiac Enzymes  No results for input(s): CKMB, TROPONINI, MYOGLOBIN in the last 168 hours.  Invalid input(s): CK ------------------------------------------------------------------------------------------------------------------    Component  Value Date/Time   BNP 1,224.1 (H) 05/10/2020 0356     Antibiotics:  Anti-infectives (From admission, onward)   Start     Dose/Rate Route Frequency Ordered Stop   05/08/20 1800  cefTRIAXone (ROCEPHIN) 2 g in sodium chloride 0.9 % 100 mL IVPB  Status:  Discontinued        2 g 200 mL/hr over 30 Minutes Intravenous Every 24 hours 05/07/20 0530 05/07/20 0539   05/08/20 0800  azithromycin (ZITHROMAX) 500 mg in sodium chloride 0.9 % 250 mL IVPB        500 mg 250 mL/hr over 60 Minutes Intravenous Every 24 hours 05/07/20 0530 05/12/20 0759   05/07/20 1800  cefTRIAXone (ROCEPHIN) 2 g in sodium chloride 0.9 % 100 mL IVPB        2 g 200 mL/hr over 30 Minutes Intravenous Every 24 hours 05/07/20 0539 05/10/20 1742   05/07/20 0430  cefTRIAXone (ROCEPHIN) 1 g in sodium chloride 0.9 % 100 mL IVPB        1 g 200 mL/hr over 30 Minutes Intravenous  Once 05/07/20 0415 05/07/20 0539   05/07/20 0430  azithromycin (ZITHROMAX) 500 mg in sodium chloride 0.9 % 250 mL IVPB        500 mg 250 mL/hr over 60 Minutes Intravenous  Once 05/07/20 0415 05/07/20 0639       Radiology Reports  DG CHEST PORT 1 VIEW  Result Date: 05/09/2020 CLINICAL DATA:  Shortness of breath. EXAM: PORTABLE CHEST 1 VIEW COMPARISON:  CT 05/07/2020.  Chest x-ray 05/07/2020. FINDINGS: Cardiac pacer in stable position. Cardiomegaly with mild pulmonary venous congestion. Persistent bilateral pulmonary infiltrates/edema with progression in the right mid lung. No pleural effusion or pneumothorax. IMPRESSION: 1. Cardiac pacer stable position. Cardiomegaly with mild pulmonary venous congestion. 2. Persistent bilateral pulmonary infiltrates/edema with progression in the right mid lung. These findings are best demonstrated by prior CT. Electronically Signed   By: Maisie Fushomas  Register   On: 05/09/2020 06:43      DVT prophylaxis: Lovenox  Code Status: Full code  Family Communication: No family at  bedside   Consultants:  Cardiology  Pulmonology  Procedures:      Objective    Physical Examination:    General-appears in no acute distress  Heart-S1-S2, regular, no murmur auscultated  Lungs-clear to auscultation bilaterally, no wheezing or crackles auscultated  Abdomen-soft, nontender, no organomegaly  Extremities-no edema in the lower extremities  Neuro-alert, oriented x3, no focal deficit noted   Status is: Inpatient  Dispo: The patient is from: Home              Anticipated d/c is to: Home              Anticipated d/c date is: 05/11/2020              Patient currently not stable for discharge  Barrier to discharge-ongoing treatment for pneumonia  COVID-19 Labs  No results for input(s): DDIMER, FERRITIN, LDH, CRP in the last 72 hours.  Lab Results  Component Value Date   SARSCOV2NAA NEGATIVE 05/07/2020    Microbiology  Recent Results (from the past 240 hour(s))  Resp Panel by RT-PCR (Flu A&B, Covid) Nasopharyngeal Swab     Status: None   Collection Time: 05/07/20  2:41 AM   Specimen: Nasopharyngeal Swab; Nasopharyngeal(NP) swabs in vial transport medium  Result Value Ref Range Status   SARS Coronavirus 2 by RT PCR NEGATIVE NEGATIVE Final    Comment: (NOTE) SARS-CoV-2 target nucleic acids are NOT DETECTED.  The SARS-CoV-2 RNA is generally detectable in upper respiratory specimens during the  acute phase of infection. The lowest concentration of SARS-CoV-2 viral copies this assay can detect is 138 copies/mL. A negative result does not preclude SARS-Cov-2 infection and should not be used as the sole basis for treatment or other patient management decisions. A negative result may occur with  improper specimen collection/handling, submission of specimen other than nasopharyngeal swab, presence of viral mutation(s) within the areas targeted by this assay, and inadequate number of viral copies(<138 copies/mL). A negative result must be combined  with clinical observations, patient history, and epidemiological information. The expected result is Negative.  Fact Sheet for Patients:  BloggerCourse.com  Fact Sheet for Healthcare Providers:  SeriousBroker.it  This test is no t yet approved or cleared by the [place] FDA and  has been authorized for detection and/or diagnosis of SARS-CoV-2 by FDA under an Emergency Use Authorization (EUA). This EUA will remain  in effect (meaning this test can be used) for the duration of the COVID-19 declaration under Section 564(b)(1) of the Act, 21 U.S.C.section 360bbb-3(b)(1), unless the authorization is terminated  or revoked sooner.       Influenza A by PCR NEGATIVE NEGATIVE Final   Influenza B by PCR NEGATIVE NEGATIVE Final    Comment: (NOTE) The Xpert Xpress SARS-CoV-2/FLU/RSV plus assay is intended as an aid in the diagnosis of influenza from Nasopharyngeal swab specimens and should not be used as a sole basis for treatment. Nasal washings and aspirates are unacceptable for Xpert Xpress SARS-CoV-2/FLU/RSV testing.  Fact Sheet for Patients: BloggerCourse.com  Fact Sheet for Healthcare Providers: SeriousBroker.it  This test is not yet approved or cleared by the [place] FDA and has been authorized for detection and/or diagnosis of SARS-CoV-2 by FDA under an Emergency Use Authorization (EUA). This EUA will remain in effect (meaning this test can be used) for the duration of the COVID-19 declaration under Section 564(b)(1) of the Act, 21 U.S.C. section 360bbb-3(b)(1), unless the authorization is terminated or revoked.  Performed at [facility], [address], [place], [place] [address]   Blood culture (routine x 2)     Status: None (Preliminary result)   Collection Time: 05/07/20  3:24 AM   Specimen: BLOOD RIGHT HAND  Result Value Ref Range Status   Specimen  Description BLOOD RIGHT HAND  Final   Special Requests   Final    BOTTLES DRAWN AEROBIC AND ANAEROBIC Blood Culture adequate volume   Culture   Final    NO GROWTH 3 DAYS Performed at [facility], [address], [place], [place] [address]    Report Status PENDING  Incomplete  Blood culture (routine x 2)     Status: None (Preliminary result)   Collection Time: 05/07/20  3:30 AM   Specimen: BLOOD LEFT HAND  Result Value Ref Range Status   Specimen Description BLOOD LEFT HAND  Final   Special Requests   Final    BOTTLES DRAWN AEROBIC AND ANAEROBIC Blood Culture adequate volume   Culture   Final    NO GROWTH 3 DAYS Performed at [facility], [address] [place], [place] [address]    Report Status PENDING  Incomplete  Culture, sputum-assessment     Status: None   Collection Time: 05/07/20  3:25 PM   Specimen: Expectorated Sputum  Result Value Ref Range Status   Specimen Description EXPECTORATED SPUTUM  Final   Special Requests NONE  Final   Sputum evaluation   Final    THIS SPECIMEN IS ACCEPTABLE FOR  SPUTUM CULTURE Performed at [facility], [address] [place], [place] [address]    Report Status 05/08/2020 FINAL  Final  Culture, Respiratory w Gram Stain     Status: None   Collection Time: 05/07/20  3:25 PM  Result Value Ref Range Status   Specimen Description EXPECTORATED SPUTUM  Final   Special Requests NONE Reflexed from [id number]  Final   Gram Stain   Final    ABUNDANT WBC PRESENT, PREDOMINANTLY PMN FEW GRAM POSITIVE COCCI IN CLUSTERS RARE GRAM POSITIVE RODS RARE GRAM NEGATIVE RODS    Culture   Final    RARE Normal respiratory flora-no Staph aureus or Pseudomonas seen Performed at [facility], [address], [place], [place] [address]    Report Status 05/10/2020 FINAL  Final             Meredeth Ide   [facility] If 7PM-7AM, please contact night-coverage at [email or web address], Office  [phone]   05/10/2020, 7:33 PM   LOS: 3 days

## 2020-05-10 NOTE — Progress Notes (Signed)
Heart Failure Stewardship Pharmacist Progress Note   PCP: Bufford Buttner, PA-C PCP-Cardiologist: No primary care provider on file.    HPI:  60 yo M with PMH of CAD, CHF, ICD, OSA, HTN, HLD, hypothyroidism, anxiety, and obesity s/p gastric bypass 6 months ago. He presented to the ED on 05/07/20 with chest pain and shortness of breath. He follows with [facility] cardiology. There was a note about him starting Entresto but did not want to fill out patient assistance paperwork. However, he has [place] Medicaid and his insurance covers Entresto completely with $0 copay. He is not following a low-sodium diet and does not weigh himself daily at home. An ECHO was done on 05/08/20 and LVEF was 30-35% (was 34% post stress test in June 2021). He has been admitted for acute on chronic systolic and diastolic heart failure and PNA.   Current HF Medications: Furosemide 40 mg IV daily Metoprolol XL 100 mg daily Entresto 49/51 mg BID Spironolactone 25 mg daily  Prior to admission HF Medications: Furosemide 40 mg daily PRN Metoprolol XL 100 mg daily Eplerenone 25 mg daily  Pertinent Lab Values: . Serum creatinine 0.83, BUN 9, Potassium 3.7, Sodium 137, BNP 1095.8, Magnesium 1.9   Vital Signs: . Weight: 275 lbs (admission weight: 278 lbs) . Blood pressure: 110/70s  . Heart rate: 80-90s   Medication Assistance / Insurance Benefits Check: Does the patient have prescription insurance?  Yes Type of insurance plan: [employer]   Outpatient Pharmacy:  Prior to admission outpatient pharmacy: [facility] Is the patient willing to use [facility] TOC pharmacy at discharge? Yes Is the patient willing to transition their outpatient pharmacy to utilize a [facility] outpatient pharmacy?   Pending    Assessment: 1. Acute on chronic systolic CHF (EF 62-69%), due to ICM. NYHA class II symptoms. - Continue furosemide 40 mg IV daily - Continue metoprolol XL 100 mg daily - Continue Entresto 49/51 mg BID - Continue spironolactone  25 mg daily - Consider starting Farxiga 10 mg daily - it appears this was last filled 03/21/20 (started during pharmacy visit on 06/10/19) but no records from [facility] cardiology thus far suggest that this was continued during last visit in February.    Plan: 1) Medication changes recommended at this time: - Start Farxiga 10 mg daily  2) Patient assistance: Sherryll Burger copay $0  3)  Education  - To be completed prior to discharge  Sharen Hones, PharmD, BCPS Heart Failure Stewardship Pharmacist Phone [phone]

## 2020-05-10 NOTE — Progress Notes (Signed)
Progress Note  Patient Name: Callaway Hailes Date of Encounter: 05/10/2020  [facility] Cardiologist: Follows at [facility]   Subjective   Patient states he is feeling much improved, coughing less phlegm today. He reports feeling more SOB around 2 AM while in bed flat. He had to sit up and felt breathing was better. He denied any chest pain, dizziness associated with the episode.   Inpatient Medications    Scheduled Meds: . aspirin EC  81 mg Oral Daily  . atorvastatin  80 mg Oral Daily  . chlorhexidine  15 mL Mouth Rinse BID  . enoxaparin (LOVENOX) injection  0.5 mg/kg Subcutaneous Daily  . FLUoxetine  20 mg Oral Daily  . furosemide  40 mg Intravenous Daily  . gabapentin  600 mg Oral TID  . levothyroxine  200 mcg Oral Q0600  . mouth rinse  15 mL Mouth Rinse q12n4p  . melatonin  3 mg Oral QHS  . metoprolol succinate  100 mg Oral Daily  . pantoprazole  40 mg Oral Daily  . potassium chloride  40 mEq Oral Daily  . sacubitril-valsartan  1 tablet Oral BID  . spironolactone  25 mg Oral Daily   Continuous Infusions: . azithromycin Stopped (05/09/20 1031)  . cefTRIAXone (ROCEPHIN)  IV Stopped (05/09/20 1754)  . magnesium sulfate bolus IVPB     PRN Meds: acetaminophen **OR** acetaminophen, albuterol, guaiFENesin, ondansetron **OR** [DISCONTINUED] ondansetron (ZOFRAN) IV, oxyCODONE, senna-docusate   Vital Signs    Vitals:   05/09/20 0559 05/09/20 0910 05/09/20 2121 05/10/20 0502  BP: 100/66 108/79 113/76 118/71  Pulse: 91 97 91 86  Resp: Temp: 98.3 F (36.8 C) 97.9 F (36.6 C) 98.2 F (36.8 C) 98.3 F (36.8 C)  TempSrc: Oral Oral Oral Oral  SpO2: 99% 98% 98%   Weight: 125.9 kg   124.8 kg  Height:        Intake/Output Summary (Last 24 hours) at 05/10/2020 1610 Last data filed at 05/10/2020 0500 Gross per 24 hour  Intake 346.98 ml  Output 1700 ml  Net -1353.02 ml   Last 3 Weights 05/10/2020 05/09/2020 05/08/2020  Weight (lbs) 275 lb 1.6 oz 277 lb  9.6 oz 282 lb 9.6 oz  Weight (kg) 124.785 kg 125.919 kg 128.187 kg      Telemetry    Sinus rhythm with rate of 90s, multifocal PVCs with intermittent bigeminy, artifacts - Personally Reviewed  ECG    No new tracing today- Personally Reviewed  Physical Exam   GEN: No acute distress. Sitting in chair.   Neck: Estimated 7cm JVD noted Cardiac: RRR, no murmurs, rubs, or gallops.  Respiratory: Lung sound with bilateral rhonchi noted, on room air, speaks in full sentence  GI: Soft, nontender, non-distended  MS: BLE no edema; No deformity. Neuro:  Alert and oriented x3, follows commands appropriately, no focal deficit noted  Psych: Normal affect   Vitals:  Vitals:   05/09/20 2121 05/10/20 0502  BP: 113/76 118/71  Pulse: 91 86  Resp: 18 19  Temp: 98.2 F (36.8 C) 98.3 F (36.8 C)  SpO2: 98%      Labs    High Sensitivity Troponin:   Recent Labs  Lab 05/07/20 0211 05/07/20 0411  TROPONINIHS 6 6      Chemistry Recent Labs  Lab 05/08/20 0247 05/09/20 0236 05/10/20 0356  NA 133* 133* 137  K 3.7 3.7 3.7  CL 102 101 101  CO2 GLUCOSE 87 86  88  BUN CREATININE 0.85 0.81 0.83  CALCIUM 8.7* 9.1 9.6  GFRNONAA >60 >60 >60  ANIONGAP Magnesium  Date Value Ref Range Status  05/09/2020 1.9 1.7 - 2.4 mg/dL Final    Comment:    Performed at [facility], [address] [place], [place] [address]   Lab Results  Component Value Date   TSH 1.992 05/10/2020    Hematology Recent Labs  Lab 05/08/20 0247 05/09/20 0236 05/10/20 0356  WBC 13.5* 10.3 10.7*  RBC 3.88* 4.03* 4.36  HGB 11.7* 12.2* 13.0  HCT 35.3* 36.3* 39.6  MCV 91.0 90.1 90.8  MCH 30.2 30.3 29.8  MCHC 33.1 33.6 32.8  RDW 15.1 15.1 15.1  PLT 183 185 217    BNP Recent Labs  Lab 05/07/20 0223 05/10/20 0356  BNP 1,095.8* 1,224.1*    Lab Results  Component Value Date   CHOL 122 05/10/2020   HDL 34 (L) 05/10/2020   LDLCALC 77 05/10/2020   TRIG 53 05/10/2020    CHOLHDL 3.6 05/10/2020    DDimer No results for input(s): DDIMER in the last 168 hours.   Radiology    DG CHEST PORT 1 VIEW  Result Date: 05/09/2020 CLINICAL DATA:  Shortness of breath. EXAM: PORTABLE CHEST 1 VIEW COMPARISON:  CT 05/07/2020.  Chest x-ray 05/07/2020. FINDINGS: Cardiac pacer in stable position. Cardiomegaly with mild pulmonary venous congestion. Persistent bilateral pulmonary infiltrates/edema with progression in the right mid lung. No pleural effusion or pneumothorax. IMPRESSION: 1. Cardiac pacer stable position. Cardiomegaly with mild pulmonary venous congestion. 2. Persistent bilateral pulmonary infiltrates/edema with progression in the right mid lung. These findings are best demonstrated by prior CT. Electronically Signed   By: Maisie Fus  Register   On: 05/09/2020 06:43   ECHOCARDIOGRAM COMPLETE  Result Date: 05/08/2020    ECHOCARDIOGRAM REPORT   Patient Name:   GABRIEN MENTINK Date of Exam: 05/08/2020 Medical Rec #:  [id number]     Height:       72.0 in Accession #:    [id number]    Weight:       282.6 lb Date of Birth:  [birth year]     BSA:          2.468 m Patient Age:    59 years      BP:           110/70 mmHg Patient Gender: M             HR:           95 bpm. Exam Location:  Inpatient Procedure: 2D Echo, Cardiac Doppler and Color Doppler Indications:    CHF-Acute Diastolic  History:        Patient has no prior history of Echocardiogram examinations.                 CHF, CAD, Defibrillator, Signs/Symptoms:Chest Pain and Shortness                 of Breath; Risk Factors:Sleep Apnea. Hypothyroidism.  Sonographer:    Ross Ludwig RDCS (AE) Referring Phys: [id number] CAROLE N HALL IMPRESSIONS  1. Left ventricular ejection fraction, by estimation, is 30 to 35%. The left ventricle has moderately decreased function. The left ventricle demonstrates global hypokinesis. There is mild concentric left ventricular hypertrophy. Left ventricular diastolic parameters are consistent with Grade I diastolic  dysfunction (impaired relaxation). Elevated left ventricular end-diastolic pressure.  2. Right ventricular systolic function is moderately reduced.  The right ventricular size is normal. There is moderately elevated pulmonary artery systolic pressure.  3. Left atrial size was severely dilated.  4. The mitral valve is normal in structure. Mild to moderate mitral valve regurgitation. No evidence of mitral stenosis.  5. The aortic valve is tricuspid. There is mild calcification of the aortic valve. There is mild thickening of the aortic valve. Aortic valve regurgitation is not visualized. No aortic stenosis is present.  6. Aortic dilatation noted. There is mild dilatation of the ascending aorta, measuring 38 mm.  7. The inferior vena cava is dilated in size with >50% respiratory variability, suggesting right atrial pressure of 8 mmHg. FINDINGS  Left Ventricle: Left ventricular ejection fraction, by estimation, is 30 to 35%. The left ventricle has moderately decreased function. The left ventricle demonstrates global hypokinesis. The left ventricular internal cavity size was normal in size. There is mild concentric left ventricular hypertrophy. Left ventricular diastolic parameters are consistent with Grade I diastolic dysfunction (impaired relaxation). Elevated left ventricular end-diastolic pressure. Right Ventricle: The right ventricular size is normal. No increase in right ventricular wall thickness. Right ventricular systolic function is moderately reduced. There is moderately elevated pulmonary artery systolic pressure. The tricuspid regurgitant velocity is 3.15 m/s, and with an assumed right atrial pressure of 8 mmHg, the estimated right ventricular systolic pressure is 47.7 mmHg. Left Atrium: Left atrial size was severely dilated. Right Atrium: Right atrial size was normal in size. Pericardium: There is no evidence of pericardial effusion. Mitral Valve: The mitral valve is normal in structure. Mild to moderate  mitral valve regurgitation. No evidence of mitral valve stenosis. Tricuspid Valve: The tricuspid valve is normal in structure. Tricuspid valve regurgitation is mild . No evidence of tricuspid stenosis. Aortic Valve: The aortic valve is tricuspid. There is mild calcification of the aortic valve. There is mild thickening of the aortic valve. Aortic valve regurgitation is not visualized. No aortic stenosis is present. Aortic valve mean gradient measures 3.0 mmHg. Aortic valve peak gradient measures 4.4 mmHg. Aortic valve area, by VTI measures 2.33 cm. Pulmonic Valve: The pulmonic valve was normal in structure. Pulmonic valve regurgitation is not visualized. No evidence of pulmonic stenosis. Aorta: Aortic dilatation noted. There is mild dilatation of the ascending aorta, measuring 38 mm. Venous: The inferior vena cava is dilated in size with greater than 50% respiratory variability, suggesting right atrial pressure of 8 mmHg. IAS/Shunts: No atrial level shunt detected by color flow Doppler. Additional Comments: A device lead is visualized.  LEFT VENTRICLE PLAX 2D LVIDd:         6.40 cm      Diastology LVIDs:         5.50 cm      LV e' medial:    8.05 cm/s LV PW:         1.20 cm      LV E/e' medial:  15.4 LV IVS:        1.20 cm      LV e' lateral:   12.00 cm/s LVOT diam:     2.20 cm      LV E/e' lateral: 10.3 LV SV:         38 LV SV Index:   15 LVOT Area:     3.80 cm  LV Volumes (MOD) LV vol d, MOD A2C: 231.0 ml LV vol d, MOD A4C: 247.0 ml LV vol s, MOD A2C: 137.0 ml LV vol s, MOD A4C: 161.0 ml LV SV MOD A2C:  94.0 ml LV SV MOD A4C:     247.0 ml LV SV MOD BP:      89.8 ml RIGHT VENTRICLE             IVC RV Basal diam:  3.20 cm     IVC diam: 2.50 cm RV S prime:     13.50 cm/s TAPSE (M-mode): 2.0 cm LEFT ATRIUM              Index       RIGHT ATRIUM           Index LA diam:        5.60 cm  2.27 cm/m  RA Area:     22.70 cm LA Vol (A2C):   141.0 ml 57.14 ml/m RA Volume:   64.50 ml  26.14 ml/m LA Vol (A4C):   122.0 ml  49.44 ml/m LA Biplane Vol: 138.0 ml 55.92 ml/m  AORTIC VALVE AV Area (Vmax):    2.40 cm AV Area (Vmean):   2.36 cm AV Area (VTI):     2.33 cm AV Vmax:           105.00 cm/s AV Vmean:          78.400 cm/s AV VTI:            0.163 m AV Peak Grad:      4.4 mmHg AV Mean Grad:      3.0 mmHg LVOT Vmax:         66.30 cm/s LVOT Vmean:        48.600 cm/s LVOT VTI:          0.100 m LVOT/AV VTI ratio: 0.61  AORTA Ao Root diam: 3.30 cm Ao Asc diam:  3.80 cm MITRAL VALVE                 TRICUSPID VALVE MV Area (PHT): 8.72 cm      TR Peak grad:   39.7 mmHg MV Decel Time: 87 msec       TR Vmax:        315.00 cm/s MR Peak grad:    56.9 mmHg MR Mean grad:    31.0 mmHg   SHUNTS MR Vmax:         377.00 cm/s Systemic VTI:  0.10 m MR Vmean:        258.0 cm/s  Systemic Diam: 2.20 cm MR PISA:         4.02 cm MR PISA Eff ROA: 38 mm MR PISA Radius:  0.80 cm MV E velocity: 124.00 cm/s MV A velocity: 45.20 cm/s MV E/A ratio:  2.74 Chilton Si MD Electronically signed by Chilton Si MD Signature Date/Time: 05/08/2020/5:36:16 PM    Final     Cardiac Studies   Gathered from Care Everywhere  Catheterization History LHC and PCI/DES x 2 to mid and distal LCx and PTCA to OM 02/28/12 (Dr. Wynona Neat) Coronary angiography was performed using multiple angulations are all coronary arteries were well visualized. Left main coronary artery:  Originated from the left coronary cusp.  It was large in size. Mild distal irregularities are noted. Left anterior descending artery:  Originated from the distal left main coronary artery.  Medium in size.  There is a widely patent stent at the first diagonal branch with 25% in stent restenosis present.  The first diagonal branch also has an ostial stent which has 50% ostial in-stent restenosis.  The remainder of LAD and diagonal branch vessels are free of significant disease.  Left Circumflex artery: Originated from the distal left main coronary artery and extended in the AV  groove.  Medium  in size.  There is a large first marginal branch which has 25% ostial narrowing.  The ongoing left circumflex artery has a 98% stenosis followed by a medium-sized second marginal branch which is free of significant disease.  The ongoing left circumflex artery has 70% distal disease. Right coronary artery:  Originated from the right coronary cusp. Dominant vessel. Medium in size.  There is 25% proximal stenosis and a 30-40% eccentric mid stenosis of the RCA.  The distal vessel branches into the posterolateral and posterior descending branches which are normal in appearance. Left ventriculography:  Not performed.  AV crossed with guide catheter after PCI for LV pressure measurements.   Conclusions:   Successful drug-eluting stent X 2 to the mid and distal LCx artery Successful PTCA of the first marginal branch artery     R/LHC 11/17/15 (Dr. Clearance Coots) Findings: Hemodynamics: Central aortic pressure: 112/76/91 LV pressure: 111/33 PCWP: 33 PA: 52/29/38 RV: 51/16 RA: 19 Thermodilution cardiac output: 5.80 L/M, index 2.19 Fick cardiac output: 5.59 L/M, index 2.11 Left ventriculography: severe global impairment of LV contraction, LVEF visually estimated at 25% Coronary angiography: The coronary circulation is right dominant Left main: normal LAD: Scattered luminal plaque, patent stent at LAD/D1 CFX: Scattered luminal plaque, patent stent in the proximal/mid segments RCA: Minor luminal plaque  Impression: 1. Severe global impairment of LV function 2. No evidence of significant obstructive CAD 3. Patent LAD, CFX stents 4. Moderate pulmonary hypertension 5. Elevated filling pressures consistent with volume overload    Echo 10/08/18: The left ventricle is moderately dilated. Global hypokinesis of the LV worse in the inferior wall. Diastolic function:abnormal. The left ventricular function is moderately decreased. The estimated left ventricular ejection fraction is 35-40%. Mild to moderate  MR.     Echocardiogram 02/28/12:  The left ventricle is mildly dilated with mild concentric left ventricular hypertrophy seen.  Segmental wall motion abnormalities are present as described, consistent with an ischemic cardiomyopathy.  Global left ventricular systolic function is severely reduced.  The estimated left ventricular ejection fraction is 35 - 40%.  There is impaired diastolic relaxation.  Mild tricuspid regurgitation is seen.  The pulmonary artery pressure could not be estimated from the TR jet due to technical limitations.  There is no evidence of pericardial effusion.    Patient Profile     Alwin Lanigan is a 60 y.o. 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 Cardiology is consulted and following for CHF at the request of Dr. Margo Aye.   Assessment & Plan    Acute on chronic systolic and diastolic heart failure Ischemic cardiomyopathy  Mild to Mod MR Pulmonary hypertension MDT ICD in place  - presented with irregular heart rate, chest pain, SOB, cough and lightheadedness - BNP 1095 - Hs trop negative x2 - CTA chest 4/24 without PE, cardiac enlargement, multifocal bronchopneumonia, small bilateral pleural effusions -  Echo 4/25 showed EF 30-35%, LV global hypokinesis, mild LVH, grade IDD, elevated LVEDP, RV systolic function moderately reduced, moderately elevated PA sysotlic pressure, RV systolic pressure is 47.7 mmHg.  LA severely dilated, mild to moderate MR, aortic dilatation 8mm.  - On eplerenone 25mg  daily and PRN Lasix 40mg  at home, compliant, he has been treated with IV  lasix 40mg  daily here, adequate response to diuresis,  Net -1353 ml over the past 24 hours, weight down 282 to 275, CXR repeated 4/26 showed mild pulmonary congestion, persistent bilateral infiltrates  -  Please  continue low sodium diet, fluid restriction <1.5-2 L daily,  monitor strict intake and output, daily renal index, daily weight, and replete electrolyte with goal of K >4 and Mag >2 - continue GDMT with Entresto, Metoprolol XL, and Spironolactone  - may transition to PO lasix once euvolemic   Multifocal PVCs with intermittent bigeminy  - asymptomatic  - replete electrolyte with goal of K >4 and Mag >2, 1g IV Mag added today  - continue telemetry  - ICD in place , continue BBlocker  - TSH WNL   CAP - managed by primary team with IV Azithromycin and CTX   CAD with hx of PCI 2014  - heart cath from 11/2015 no obstructive CAD and patent LAD, CFX stents - Hs trop negative x2 - chest pain has resolved, likely due to pneumonia and CHF - continue medical therapy with ASA, metoprolol XL, statin and Entresto  HTN - BP was low normal, currently on home regimen Lasix, Metoprolol, Spironolactone, and Entresto, tolerating   HLD - LDL 77 and HDL 34 - continue 80mg  lipitor  OSA - compliant with CPAP  Hypothyroidism - WNL TSH - on levothyroxine 200 mcg daily       Follow up with [facility] cardiology upon discharge    For questions or updates, please contact [facility] Please consult [email or web address] for contact info under        Signed, , NP  05/10/2020, 8:22 AM

## 2020-05-11 ENCOUNTER — Other Ambulatory Visit (HOSPITAL_COMMUNITY): Payer: Self-pay

## 2020-05-11 MED ORDER — FUROSEMIDE 40 MG PO TABS
40.0000 mg | ORAL_TABLET | Freq: Every day | ORAL | Status: DC
  Administered 2020-05-11: 40 mg via ORAL
  Filled 2020-05-11: qty 1

## 2020-05-11 MED ORDER — FUROSEMIDE 40 MG PO TABS
40.0000 mg | ORAL_TABLET | Freq: Every day | ORAL | 2 refills | Status: AC
Start: 2020-05-11 — End: ?
  Filled 2020-05-11: qty 30, 30d supply, fill #0

## 2020-05-11 MED ORDER — DAPAGLIFLOZIN PROPANEDIOL 10 MG PO TABS
10.0000 mg | ORAL_TABLET | Freq: Every day | ORAL | 0 refills | Status: AC
  Filled 2020-05-11: qty 30, 30d supply, fill #0

## 2020-05-11 NOTE — Care Management (Signed)
1036 05-11-20 Case Manager was asked by attending to speak with the patient again concerning his CPAP and supplies. Case Manager called [employer] and spoke with Respiratory Therapist Tiffany. Tiffany states that the patient has been approved for a new CPAP and supplies. However, production for the [employer] has been delayed 2/2 the chips in the CPAP. Therapist was not aware of ETA of the new CPAP. Patient can get new tubing delivered now. Patient is aware to provide the adequate telephone number to the company as well so they can contact him in the future. Case Manager educated the patient regarding cleaning the CPAP daily and only using distilled H20 in the chamber. Educated the patient on cleaning the CPAP daily using dish soap and H20 to clean and he can use 1 part vinegar and 3 parts H20 for cleaning as well. Patient verbalized understanding of the cleaning process and the wife was at the bedside. Case Manager encouraged the patient to utilize the CPAP Clinic at [facility] in [place] on Fridays from 9 am-12 pm for additional questions and demonstrations of use. Case Manager asked the patient to call the Respiratory Therapist Tiffany again to reiterate the information above. Patient is aware that he has to clean the CPAP at home prior to next use. No further needs from Case Manager at this time. Gala Lewandowsky, RN,BSN Case Manager

## 2020-05-11 NOTE — Progress Notes (Signed)
Heart Failure Stewardship Pharmacist Progress Note   PCP: Bufford Buttner, PA-C PCP-Cardiologist: No primary care provider on file.    HPI:  60 yo M with PMH of CAD, CHF, ICD, OSA, HTN, HLD, hypothyroidism, anxiety, and obesity s/p gastric bypass 6 months ago. He presented to the ED on 05/07/20 with chest pain and shortness of breath. He follows with [facility] cardiology. There was a note about him starting Entresto but did not want to fill out patient assistance paperwork. However, he has [place] Medicaid and his insurance covers Entresto completely with $0 copay. He is not following a low-sodium diet and does not weigh himself daily at home. An ECHO was done on 05/08/20 and LVEF was 30-35% (was 34% post stress test in June 2021). He has been admitted for acute on chronic systolic and diastolic heart failure and PNA.   Current HF Medications: Furosemide 40 mg PO daily Metoprolol XL 100 mg daily Entresto 49/51 mg BID Spironolactone 25 mg daily Farxiga 10 mg daily  Prior to admission HF Medications: Furosemide 40 mg daily PRN Metoprolol XL 100 mg daily Eplerenone 25 mg daily  Pertinent Lab Values: . Serum creatinine 0.83, BUN 9, Potassium 3.7, Sodium 137, BNP 1095.8, Magnesium 1.9   Vital Signs: . Weight: 274 lbs (admission weight: 278 lbs) . Blood pressure: 120/70s  . Heart rate: 80-90s   Medication Assistance / Insurance Benefits Check: Does the patient have prescription insurance?  Yes Type of insurance plan: [employer]   Outpatient Pharmacy:  Prior to admission outpatient pharmacy: [facility] Is the patient willing to use [facility] TOC pharmacy at discharge? Yes Is the patient willing to transition their outpatient pharmacy to utilize a [facility] outpatient pharmacy?   Pending    Assessment: 1. Acute on chronic systolic CHF (EF 81-44%), due to ICM. NYHA class II symptoms. - Agree with transitioning to furosemide 40 mg PO daily - Continue metoprolol XL 100 mg daily - Continue Entresto  49/51 mg BID - Continue spironolactone 25 mg daily - Continue Farxiga 10 mg daily   Plan: 1) Medication changes recommended at this time: - Continue current regimen  2) Patient assistance: - Entresto copay $0  3)  Education  - To be completed prior to discharge  Sharen Hones, PharmD, BCPS Heart Failure Engineer, building services Phone [phone]

## 2020-05-11 NOTE — Progress Notes (Signed)
Progress Note  Patient Name: Todd Rivas Date of Encounter: 05/11/2020  [facility] Cardiologist: Follows at [facility]   Subjective   Wants to go home no dyspnea   Inpatient Medications    Scheduled Meds: . aspirin EC  81 mg Oral Daily  . atorvastatin  80 mg Oral Daily  . chlorhexidine  15 mL Mouth Rinse BID  . dapagliflozin propanediol  10 mg Oral Daily  . enoxaparin (LOVENOX) injection  0.5 mg/kg Subcutaneous Daily  . FLUoxetine  20 mg Oral Daily  . furosemide  40 mg Intravenous Daily  . gabapentin  600 mg Oral TID  . levothyroxine  200 mcg Oral Q0600  . mouth rinse  15 mL Mouth Rinse q12n4p  . melatonin  3 mg Oral QHS  . metoprolol succinate  100 mg Oral Daily  . pantoprazole  40 mg Oral Daily  . potassium chloride  40 mEq Oral Daily  . sacubitril-valsartan  1 tablet Oral BID  . spironolactone  25 mg Oral Daily   Continuous Infusions: . azithromycin 500 mg (05/10/20 0836)   PRN Meds: acetaminophen **OR** acetaminophen, albuterol, guaiFENesin, ondansetron **OR** [DISCONTINUED] ondansetron (ZOFRAN) IV, oxyCODONE, senna-docusate   Vital Signs    Vitals:   05/10/20 1353 05/10/20 2057 05/11/20 0500 05/11/20 0718  BP: 111/76 111/65  119/76  Pulse: 90   95  Resp: 16 18    Temp: 98.3 F (36.8 C) 98.3 F (36.8 C)  98.3 F (36.8 C)  TempSrc: Oral Oral  Oral  SpO2:  95%    Weight:   124.6 kg   Height:        Intake/Output Summary (Last 24 hours) at 05/11/2020 0806 Last data filed at 05/10/2020 2058 Gross per 24 hour  Intake 500 ml  Output 875 ml  Net -375 ml   Last 3 Weights 05/11/2020 05/10/2020 05/09/2020  Weight (lbs) 274 lb 12.8 oz 275 lb 1.6 oz 277 lb 9.6 oz  Weight (kg) 124.648 kg 124.785 kg 125.919 kg      Telemetry    Sinus rhythm with rate of 90s, multifocal PVCs with intermittent bigeminy, artifacts - Personally Reviewed  ECG    No new tracing today- Personally Reviewed  Physical Exam   GEN: No acute distress. Sitting in chair.    Neck: Estimated 7cm JVD noted Cardiac: RRR, no murmurs, rubs, or gallops.  Respiratory: Lung sound with bilateral rhonchi noted, on room air, speaks in full sentence  GI: Soft, nontender, non-distended  MS: BLE no edema; No deformity. Neuro:  Alert and oriented x3, follows commands appropriately, no focal deficit noted  Psych: Normal affect   Vitals:  Vitals:   05/10/20 2057 05/11/20 0718  BP: 111/65 119/76  Pulse:  95  Resp: 18   Temp: 98.3 F (36.8 C) 98.3 F (36.8 C)  SpO2: 95%      Labs    High Sensitivity Troponin:   Recent Labs  Lab 05/07/20 0211 05/07/20 0411  TROPONINIHS 6 6      Chemistry Recent Labs  Lab 05/08/20 0247 05/09/20 0236 05/10/20 0356  NA 133* 133* 137  K 3.7 3.7 3.7  CL 102 101 101  CO2 23 22 24   GLUCOSE 87 86 88  BUN 12 9 9   CREATININE 0.85 0.81 0.83  CALCIUM 8.7* 9.1 9.6  GFRNONAA >60 >60 >60  ANIONGAP 8 10 12     Magnesium  Date Value Ref Range Status  05/09/2020 1.9 1.7 - 2.4 mg/dL Final    Comment:  Performed at [facility], [address] [place], [place] [address]   Lab Results  Component Value Date   TSH 1.992 05/10/2020    Hematology Recent Labs  Lab 05/08/20 0247 05/09/20 0236 05/10/20 0356  WBC 13.5* 10.3 10.7*  RBC 3.88* 4.03* 4.36  HGB 11.7* 12.2* 13.0  HCT 35.3* 36.3* 39.6  MCV 91.0 90.1 90.8  MCH 30.2 30.3 29.8  MCHC 33.1 33.6 32.8  RDW 15.1 15.1 15.1  PLT 183 185 217    BNP Recent Labs  Lab 05/07/20 0223 05/10/20 0356  BNP 1,095.8* 1,224.1*    Lab Results  Component Value Date   CHOL 122 05/10/2020   HDL 34 (L) 05/10/2020   LDLCALC 77 05/10/2020   TRIG 53 05/10/2020   CHOLHDL 3.6 05/10/2020    DDimer No results for input(s): DDIMER in the last 168 hours.   Radiology    No results found.  Cardiac Studies   Gathered from Care Everywhere  Catheterization History LHC and PCI/DES x 2 to mid and distal LCx and PTCA to OM 02/28/12 (Dr. Wynona Neat) Coronary angiography was  performed using multiple angulations are all coronary arteries were well visualized. Left main coronary artery:  Originated from the left coronary cusp.  It was large in size. Mild distal irregularities are noted. Left anterior descending artery:  Originated from the distal left main coronary artery.  Medium in size.  There is a widely patent stent at the first diagonal branch with 25% in stent restenosis present.  The first diagonal branch also has an ostial stent which has 50% ostial in-stent restenosis.  The remainder of LAD and diagonal branch vessels are free of significant disease. Left Circumflex artery: Originated from the distal left main coronary artery and extended in the AV  groove.  Medium in size.  There is a large first marginal branch which has 25% ostial narrowing.  The ongoing left circumflex artery has a 98% stenosis followed by a medium-sized second marginal branch which is free of significant disease.  The ongoing left circumflex artery has 70% distal disease. Right coronary artery:  Originated from the right coronary cusp. Dominant vessel. Medium in size.  There is 25% proximal stenosis and a 30-40% eccentric mid stenosis of the RCA.  The distal vessel branches into the posterolateral and posterior descending branches which are normal in appearance. Left ventriculography:  Not performed.  AV crossed with guide catheter after PCI for LV pressure measurements.   Conclusions:   Successful drug-eluting stent X 2 to the mid and distal LCx artery Successful PTCA of the first marginal branch artery     R/LHC 11/17/15 (Dr. Clearance Coots) Findings: Hemodynamics: Central aortic pressure: 112/76/91 LV pressure: 111/33 PCWP: 33 PA: 52/29/38 RV: 51/16 RA: 19 Thermodilution cardiac output: 5.80 L/M, index 2.19 Fick cardiac output: 5.59 L/M, index 2.11 Left ventriculography: severe global impairment of LV contraction, LVEF visually estimated at 25% Coronary angiography: The coronary  circulation is right dominant Left main: normal LAD: Scattered luminal plaque, patent stent at LAD/D1 CFX: Scattered luminal plaque, patent stent in the proximal/mid segments RCA: Minor luminal plaque  Impression: 1. Severe global impairment of LV function 2. No evidence of significant obstructive CAD 3. Patent LAD, CFX stents 4. Moderate pulmonary hypertension 5. Elevated filling pressures consistent with volume overload    Echo 10/08/18: The left ventricle is moderately dilated. Global hypokinesis of the LV worse in the inferior wall. Diastolic function:abnormal. The left ventricular function is moderately decreased. The estimated left ventricular  ejection fraction is 35-40%. Mild to moderate MR.     Echocardiogram 02/28/12:  The left ventricle is mildly dilated with mild concentric left ventricular hypertrophy seen.  Segmental wall motion abnormalities are present as described, consistent with an ischemic cardiomyopathy.  Global left ventricular systolic function is severely reduced.  The estimated left ventricular ejection fraction is 35 - 40%.  There is impaired diastolic relaxation.  Mild tricuspid regurgitation is seen.  The pulmonary artery pressure could not be estimated from the TR jet due to technical limitations.  There is no evidence of pericardial effusion.    Patient Profile     Todd Rivas is a 60 y.o. 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 Cardiology is consulted and following for CHF at the request of Dr. Margo Aye.   Assessment & Plan    Acute on chronic systolic and diastolic heart failure Ischemic cardiomyopathy  Mild to Mod MR Pulmonary hypertension MDT ICD in place  - presented with irregular heart rate, chest pain, SOB, cough and lightheadedness - BNP  1095 - Hs trop negative x2 - CTA chest 4/24 without PE, cardiac enlargement, multifocal bronchopneumonia, small bilateral pleural effusions -  Echo 4/25 showed EF 30-35%, LV global hypokinesis, mild LVH, grade IDD, elevated LVEDP, RV systolic function moderately reduced, moderately elevated PA sysotlic pressure, RV systolic pressure is 47.7 mmHg.  LA severely dilated, mild to moderate MR, aortic dilatation 55mm.  - Oral lasix   Multifocal PVCs with intermittent bigeminy  - asymptomatic  - replete electrolyte with goal of K >4 and Mag >2, 1g IV Mag added today  - continue telemetry  - ICD in place , continue BBlocker  - TSH WNL   CAP - managed by primary team with IV Azithromycin and CTX   CAD with hx of PCI 2014  - heart cath from 11/2015 no obstructive CAD and patent LAD, CFX stents - Hs trop negative x2 - chest pain has resolved, likely due to pneumonia and CHF - continue medical therapy with ASA, metoprolol XL, statin and Entresto  HTN - BP was low normal, currently on home regimen Lasix, Metoprolol, Spironolactone, and Entresto, tolerating   HLD - LDL 77 and HDL 34 - continue 80mg  lipitor  OSA - compliant with CPAP  Hypothyroidism - WNL TSH - on levothyroxine 200 mcg daily       Follow up with [facility] upon discharge  Ok to d/c home   For questions or updates, please contact [facility] Please consult [email or web address] for contact info under        Signed, , MD  05/11/2020, 8:06 AM

## 2020-05-11 NOTE — Discharge Summary (Signed)
Physician Discharge Summary  Todd Rivas ZJQ:[id number] DOB: 12/02/1960 DOA: 05/07/2020  PCP: Bufford Buttner, PA-C  Admit date: 05/07/2020 Discharge date: 05/11/2020  Time spent: 50 minutes  Recommendations for Outpatient Follow-up:  1. Follow-up PCP in 2 weeks 2. Follow-up cardiology as outpatient   Discharge Diagnoses:  Principal Problem:   Multifocal pneumonia Active Problems:   Chest pain   Chronic combined systolic and diastolic CHF (congestive heart failure) (HCC)   CAD (coronary artery disease)   Anxiety   Hypothyroidism   OSA on CPAP   Discharge Condition: Stable  Diet recommendation: Heart healthy diet  Filed Weights   05/09/20 0559 05/10/20 0502 05/11/20 0500  Weight: 125.9 kg 124.8 kg 124.6 kg    History of present illness:  60 year old male with medical history of chronic combined systolic and diastolic CHF, hypothyroidism, CAD some OSA on CPAP, hypothyroidism, anxiety, chronic pain came to ED for evaluation of shortness of breath, productive cough, chest pain and lightheadedness.  Patient reported 2 to 3 days of shortness of breath initially with exertion, but worsening to the point where he is now dyspneic at rest.  Self-reported compliance with CPAP machine and mask full of mold.  He was told by son not to use it but patient continue to use it.  States that he contacted the company but has not yet received a replacement. Work-up revealed multifocal pneumonia for which she was started on Rocephin and Zithromax.  Also found to be in acute systolic CHF, started on IV diuresis  Hospital Course:   1. Multifocal pneumonia, POA-patient found to have multifocal pneumonia with evidence of pulmonary infiltrates on chest x-ray and CT chest.  Started on Rocephin and Zithromax.  Sputum culture only grew normal flora.  Pulmonology was consulted and recommended to continue treatment for 5 days of IV antibiotics.  Patient has completed 5 days of IV antibiotics in the  hospital.  2. ?  Fungal pneumonitis-self-reported exposure to mold from CPAP mask.  Pulmonology has recommended to get new CPAP machine.  CPAP machine will be delivered to the patient at home.  3. Persistent dyspnea-likely due to above, pulmonology also recommending sleep study as outpatient.  4. Hypothyroidism-continue Synthroid  5. Acute on chronic combined systolic and diastolic CHF-BNP was elevated greater than 2000, pulmonary edema seen on chest x-ray and CT chest.  Patient started on IV diuresis with Lasix.  Will discharge on Lasix 40 mg daily, Entresto, eplerenone, metoprolol  6. CPAP-continue CPAP nightly  7. CAD-no chest pain, continue aspirin, statin, beta-blocker.  8. Chronic anxiety-continue Prozac  9. Chronic pain syndrome-continue gabapentin, as needed oxycodone   Procedures:    Consultations:  Cardiology  Pulmonology  Discharge Exam: Vitals:   05/11/20 0918 05/11/20 0919  BP: 121/82 121/82  Pulse: (!) 101 (!) 109  Resp: 18   Temp: 98.4 F (36.9 C)   SpO2: 97%     General: Appears in no acute distress Cardiovascular: S1-S2, regular Respiratory: Clear to auscultation bilaterally  Discharge Instructions   Discharge Instructions    Diet - low sodium heart healthy   Complete by: As directed    Increase activity slowly   Complete by: As directed      Allergies as of 05/11/2020      Reactions   Lisinopril    Other reaction(s): Cough Other reaction(s): Cough      Medication List    TAKE these medications   albuterol 108 (90 Base) MCG/ACT inhaler Commonly known as: VENTOLIN HFA Inhale 2 puffs into  the lungs every 6 (six) hours as needed for shortness of breath.   aspirin 81 MG EC tablet Take 81 mg by mouth daily.   atorvastatin 80 MG tablet Commonly known as: LIPITOR Take 80 mg by mouth every evening.   dapagliflozin propanediol 10 MG Tabs tablet Commonly known as: FARXIGA Take 1 tablet (10 mg total) by mouth daily. Start taking on:  May 12, 2020   Entresto 49-51 MG Generic drug: sacubitril-valsartan Take 1 tablet by mouth 2 (two) times daily.   eplerenone 25 MG tablet Commonly known as: INSPRA Take 25 mg by mouth daily.   FLUoxetine 20 MG capsule Commonly known as: PROZAC Take 20 mg by mouth daily.   furosemide 40 MG tablet Commonly known as: LASIX Take 1 tablet (40 mg total) by mouth daily. What changed:   when to take this  reasons to take this   gabapentin 600 MG tablet Commonly known as: NEURONTIN Take 600-1,200 mg by mouth See admin instructions. Take 1 tablet by mouth in the morning, and 2 tablets by mouth in the evening   levothyroxine 200 MCG tablet Commonly known as: SYNTHROID Take 200 mcg by mouth every morning.   magnesium oxide 400 (241.3 Mg) MG tablet Commonly known as: MAG-OX Take 400 mg by mouth daily.   metoprolol succinate 100 MG 24 hr tablet Commonly known as: TOPROL-XL Take 100 mg by mouth at bedtime.   nitroGLYCERIN 0.4 MG SL tablet Commonly known as: NITROSTAT Place 0.4 mg under the tongue every 5 (five) minutes x 3 doses as needed for chest pain.   OVER THE COUNTER MEDICATION Take 1 capsule by mouth 2 (two) times daily. Super Beta Prostate   Oxycodone HCl 10 MG Tabs Take 10 mg by mouth 2 (two) times daily as needed for pain.   pantoprazole 40 MG tablet Commonly known as: PROTONIX Take 40 mg by mouth daily.   sildenafil 100 MG tablet Commonly known as: VIAGRA Take 100 mg by mouth daily as needed for erectile dysfunction.      Allergies  Allergen Reactions  . Lisinopril     Other reaction(s): Cough Other reaction(s): Cough       The results of significant diagnostics from this hospitalization (including imaging, microbiology, ancillary and laboratory) are listed below for reference.    Significant Diagnostic Studies: CT Angio Chest PE W and/or Wo Contrast  Result Date: 05/07/2020 CLINICAL DATA:  Chest pain and shortness of breath. EXAM: CT  ANGIOGRAPHY CHEST WITH CONTRAST TECHNIQUE: Multidetector CT imaging of the chest was performed using the standard protocol during bolus administration of intravenous contrast. Multiplanar CT image reconstructions and MIPs were obtained to evaluate the vascular anatomy. CONTRAST:  67mL OMNIPAQUE IOHEXOL 350 MG/ML SOLN COMPARISON:  Chest radiograph 05/07/2020 FINDINGS: Cardiovascular: Examination is limited by streak artifact and motion. There is moderately good opacification of the central and proximal segmental pulmonary arteries. No focal filling defects demonstrated. No evidence of significant pulmonary embolus. Cardiac enlargement. No pericardial effusions. Coronary artery and aortic calcification. No aortic aneurysm. Cardiac pacemaker. Mediastinum/Nodes: Esophagus is decompressed. No significant lymphadenopathy in the chest. Lungs/Pleura: Motion artifact limits examination. There are small bilateral pleural effusions. Nodular perihilar infiltrates in both lungs, greater on the right. Changes likely represent multifocal bronchopneumonia. Edema would be less likely to have this appearance. No pneumothorax. Upper Abdomen: No acute abnormalities demonstrated in the visualized upper abdomen. Postoperative changes consistent with gastric bypass. Cyst in the upper pole of the left kidney. Musculoskeletal: Degenerative changes in the spine. Review  of the MIP images confirms the above findings. IMPRESSION: 1. No evidence of significant pulmonary embolus. 2. Cardiac enlargement. 3. Nodular perihilar infiltrates in both lungs, greater on the right, likely representing multifocal bronchopneumonia. Edema would be less likely to have this appearance. 4. Small bilateral pleural effusions. 5. Aortic atherosclerosis. 6. Postoperative changes consistent with gastric bypass. Aortic Atherosclerosis (ICD10-I70.0). Electronically Signed   By: Burman Nieves M.D.   On: 05/07/2020 03:37   DG CHEST PORT 1 VIEW  Result Date:  05/09/2020 CLINICAL DATA:  Shortness of breath. EXAM: PORTABLE CHEST 1 VIEW COMPARISON:  CT 05/07/2020.  Chest x-ray 05/07/2020. FINDINGS: Cardiac pacer in stable position. Cardiomegaly with mild pulmonary venous congestion. Persistent bilateral pulmonary infiltrates/edema with progression in the right mid lung. No pleural effusion or pneumothorax. IMPRESSION: 1. Cardiac pacer stable position. Cardiomegaly with mild pulmonary venous congestion. 2. Persistent bilateral pulmonary infiltrates/edema with progression in the right mid lung. These findings are best demonstrated by prior CT. Electronically Signed   By: Maisie Fus  Register   On: 05/09/2020 06:43   DG Chest Portable 1 View  Result Date: 05/07/2020 CLINICAL DATA:  Chest pain and abnormal heart rate. EXAM: PORTABLE CHEST 1 VIEW COMPARISON:  None. FINDINGS: Cardiac pacemaker. Cardiac enlargement. Right perihilar and middle lobe infiltrate suggesting pneumonia. No pleural effusions. No pneumothorax. Mediastinal contours appear intact. IMPRESSION: Cardiac enlargement. Right perihilar and middle lobe infiltrate suggesting pneumonia. Electronically Signed   By: Burman Nieves M.D.   On: 05/07/2020 02:49   ECHOCARDIOGRAM COMPLETE  Result Date: 05/08/2020    ECHOCARDIOGRAM REPORT   Patient Name:   Todd Rivas Date of Exam: 05/08/2020 Medical Rec #:  [id number]     Height:       72.0 in Accession #:    [id number]    Weight:       282.6 lb Date of Birth:  07-Mar-1960     BSA:          2.468 m Patient Age:    59 years      BP:           110/70 mmHg Patient Gender: M             HR:           95 bpm. Exam Location:  Inpatient Procedure: 2D Echo, Cardiac Doppler and Color Doppler Indications:    CHF-Acute Diastolic  History:        Patient has no prior history of Echocardiogram examinations.                 CHF, CAD, Defibrillator, Signs/Symptoms:Chest Pain and Shortness                 of Breath; Risk Factors:Sleep Apnea. Hypothyroidism.  Sonographer:    Ross Ludwig  RDCS (AE) Referring Phys: [id number] CAROLE N HALL IMPRESSIONS  1. Left ventricular ejection fraction, by estimation, is 30 to 35%. The left ventricle has moderately decreased function. The left ventricle demonstrates global hypokinesis. There is mild concentric left ventricular hypertrophy. Left ventricular diastolic parameters are consistent with Grade I diastolic dysfunction (impaired relaxation). Elevated left ventricular end-diastolic pressure.  2. Right ventricular systolic function is moderately reduced. The right ventricular size is normal. There is moderately elevated pulmonary artery systolic pressure.  3. Left atrial size was severely dilated.  4. The mitral valve is normal in structure. Mild to moderate mitral valve regurgitation. No evidence of mitral stenosis.  5. The aortic valve is tricuspid. There is mild calcification of the  aortic valve. There is mild thickening of the aortic valve. Aortic valve regurgitation is not visualized. No aortic stenosis is present.  6. Aortic dilatation noted. There is mild dilatation of the ascending aorta, measuring 38 mm.  7. The inferior vena cava is dilated in size with >50% respiratory variability, suggesting right atrial pressure of 8 mmHg. FINDINGS  Left Ventricle: Left ventricular ejection fraction, by estimation, is 30 to 35%. The left ventricle has moderately decreased function. The left ventricle demonstrates global hypokinesis. The left ventricular internal cavity size was normal in size. There is mild concentric left ventricular hypertrophy. Left ventricular diastolic parameters are consistent with Grade I diastolic dysfunction (impaired relaxation). Elevated left ventricular end-diastolic pressure. Right Ventricle: The right ventricular size is normal. No increase in right ventricular wall thickness. Right ventricular systolic function is moderately reduced. There is moderately elevated pulmonary artery systolic pressure. The tricuspid regurgitant velocity is  3.15 m/s, and with an assumed right atrial pressure of 8 mmHg, the estimated right ventricular systolic pressure is 47.7 mmHg. Left Atrium: Left atrial size was severely dilated. Right Atrium: Right atrial size was normal in size. Pericardium: There is no evidence of pericardial effusion. Mitral Valve: The mitral valve is normal in structure. Mild to moderate mitral valve regurgitation. No evidence of mitral valve stenosis. Tricuspid Valve: The tricuspid valve is normal in structure. Tricuspid valve regurgitation is mild . No evidence of tricuspid stenosis. Aortic Valve: The aortic valve is tricuspid. There is mild calcification of the aortic valve. There is mild thickening of the aortic valve. Aortic valve regurgitation is not visualized. No aortic stenosis is present. Aortic valve mean gradient measures 3.0 mmHg. Aortic valve peak gradient measures 4.4 mmHg. Aortic valve area, by VTI measures 2.33 cm. Pulmonic Valve: The pulmonic valve was normal in structure. Pulmonic valve regurgitation is not visualized. No evidence of pulmonic stenosis. Aorta: Aortic dilatation noted. There is mild dilatation of the ascending aorta, measuring 38 mm. Venous: The inferior vena cava is dilated in size with greater than 50% respiratory variability, suggesting right atrial pressure of 8 mmHg. IAS/Shunts: No atrial level shunt detected by color flow Doppler. Additional Comments: A device lead is visualized.  LEFT VENTRICLE PLAX 2D LVIDd:         6.40 cm      Diastology LVIDs:         5.50 cm      LV e' medial:    8.05 cm/s LV PW:         1.20 cm      LV E/e' medial:  15.4 LV IVS:        1.20 cm      LV e' lateral:   12.00 cm/s LVOT diam:     2.20 cm      LV E/e' lateral: 10.3 LV SV:         38 LV SV Index:   15 LVOT Area:     3.80 cm  LV Volumes (MOD) LV vol d, MOD A2C: 231.0 ml LV vol d, MOD A4C: 247.0 ml LV vol s, MOD A2C: 137.0 ml LV vol s, MOD A4C: 161.0 ml LV SV MOD A2C:     94.0 ml LV SV MOD A4C:     247.0 ml LV SV MOD BP:       89.8 ml RIGHT VENTRICLE             IVC RV Basal diam:  3.20 cm     IVC diam: 2.50 cm RV  S prime:     13.50 cm/s TAPSE (M-mode): 2.0 cm LEFT ATRIUM              Index       RIGHT ATRIUM           Index LA diam:        5.60 cm  2.27 cm/m  RA Area:     22.70 cm LA Vol (A2C):   141.0 ml 57.14 ml/m RA Volume:   64.50 ml  26.14 ml/m LA Vol (A4C):   122.0 ml 49.44 ml/m LA Biplane Vol: 138.0 ml 55.92 ml/m  AORTIC VALVE AV Area (Vmax):    2.40 cm AV Area (Vmean):   2.36 cm AV Area (VTI):     2.33 cm AV Vmax:           105.00 cm/s AV Vmean:          78.400 cm/s AV VTI:            0.163 m AV Peak Grad:      4.4 mmHg AV Mean Grad:      3.0 mmHg LVOT Vmax:         66.30 cm/s LVOT Vmean:        48.600 cm/s LVOT VTI:          0.100 m LVOT/AV VTI ratio: 0.61  AORTA Ao Root diam: 3.30 cm Ao Asc diam:  3.80 cm MITRAL VALVE                 TRICUSPID VALVE MV Area (PHT): 8.72 cm      TR Peak grad:   39.7 mmHg MV Decel Time: 87 msec       TR Vmax:        315.00 cm/s MR Peak grad:    56.9 mmHg MR Mean grad:    31.0 mmHg   SHUNTS MR Vmax:         377.00 cm/s Systemic VTI:  0.10 m MR Vmean:        258.0 cm/s  Systemic Diam: 2.20 cm MR PISA:         4.02 cm MR PISA Eff ROA: 38 mm MR PISA Radius:  0.80 cm MV E velocity: 124.00 cm/s MV A velocity: 45.20 cm/s MV E/A ratio:  2.74 Chilton Si MD Electronically signed by Chilton Si MD Signature Date/Time: 05/08/2020/5:36:16 PM    Final     Microbiology: Recent Results (from the past 240 hour(s))  Resp Panel by RT-PCR (Flu A&B, Covid) Nasopharyngeal Swab     Status: None   Collection Time: 05/07/20  2:41 AM   Specimen: Nasopharyngeal Swab; Nasopharyngeal(NP) swabs in vial transport medium  Result Value Ref Range Status   SARS Coronavirus 2 by RT PCR NEGATIVE NEGATIVE Final    Comment: (NOTE) SARS-CoV-2 target nucleic acids are NOT DETECTED.  The SARS-CoV-2 RNA is generally detectable in upper respiratory specimens during the acute phase of infection. The  lowest concentration of SARS-CoV-2 viral copies this assay can detect is 138 copies/mL. A negative result does not preclude SARS-Cov-2 infection and should not be used as the sole basis for treatment or other patient management decisions. A negative result may occur with  improper specimen collection/handling, submission of specimen other than nasopharyngeal swab, presence of viral mutation(s) within the areas targeted by this assay, and inadequate number of viral copies(<138 copies/mL). A negative result must be combined with clinical observations, patient history, and epidemiological information. The expected result is Negative.  Fact Sheet for Patients:  BloggerCourse.comhttps://www.fda.gov/media/152166/download  Fact Sheet for Healthcare Providers:  SeriousBroker.ithttps://www.fda.gov/media/152162/download  This test is no t yet approved or cleared by the [place] FDA and  has been authorized for detection and/or diagnosis of SARS-CoV-2 by FDA under an Emergency Use Authorization (EUA). This EUA will remain  in effect (meaning this test can be used) for the duration of the COVID-19 declaration under Section 564(b)(1) of the Act, 21 U.S.C.section 360bbb-3(b)(1), unless the authorization is terminated  or revoked sooner.       Influenza A by PCR NEGATIVE NEGATIVE Final   Influenza B by PCR NEGATIVE NEGATIVE Final    Comment: (NOTE) The Xpert Xpress SARS-CoV-2/FLU/RSV plus assay is intended as an aid in the diagnosis of influenza from Nasopharyngeal swab specimens and should not be used as a sole basis for treatment. Nasal washings and aspirates are unacceptable for Xpert Xpress SARS-CoV-2/FLU/RSV testing.  Fact Sheet for Patients: BloggerCourse.comhttps://www.fda.gov/media/152166/download  Fact Sheet for Healthcare Providers: SeriousBroker.ithttps://www.fda.gov/media/152162/download  This test is not yet approved or cleared by the [place] FDA and has been authorized for detection and/or diagnosis of SARS-CoV-2 by FDA under  an Emergency Use Authorization (EUA). This EUA will remain in effect (meaning this test can be used) for the duration of the COVID-19 declaration under Section 564(b)(1) of the Act, 21 U.S.C. section 360bbb-3(b)(1), unless the authorization is terminated or revoked.  Performed at [facility], [address] [place], [place] [address]   Blood culture (routine x 2)     Status: None (Preliminary result)   Collection Time: 05/07/20  3:24 AM   Specimen: BLOOD RIGHT HAND  Result Value Ref Range Status   Specimen Description BLOOD RIGHT HAND  Final   Special Requests   Final    BOTTLES DRAWN AEROBIC AND ANAEROBIC Blood Culture adequate volume   Culture   Final    NO GROWTH 4 DAYS Performed at [facility], [address] [place], [place] [address]    Report Status PENDING  Incomplete  Blood culture (routine x 2)     Status: None (Preliminary result)   Collection Time: 05/07/20  3:30 AM   Specimen: BLOOD LEFT HAND  Result Value Ref Range Status   Specimen Description BLOOD LEFT HAND  Final   Special Requests   Final    BOTTLES DRAWN AEROBIC AND ANAEROBIC Blood Culture adequate volume   Culture   Final    NO GROWTH 4 DAYS Performed at [facility], [address] [place], [place] [address]    Report Status PENDING  Incomplete  Culture, sputum-assessment     Status: None   Collection Time: 05/07/20  3:25 PM   Specimen: Expectorated Sputum  Result Value Ref Range Status   Specimen Description EXPECTORATED SPUTUM  Final   Special Requests NONE  Final   Sputum evaluation   Final    THIS SPECIMEN IS ACCEPTABLE FOR SPUTUM CULTURE Performed at [facility], [address] [place], [place] [address]    Report Status 05/08/2020 FINAL  Final  Culture, Respiratory w Gram Stain     Status: None   Collection Time: 05/07/20  3:25 PM  Result Value Ref Range Status   Specimen Description EXPECTORATED SPUTUM  Final   Special Requests NONE Reflexed from [id number]  Final    Gram Stain   Final    ABUNDANT WBC PRESENT, PREDOMINANTLY PMN FEW GRAM POSITIVE COCCI IN CLUSTERS RARE GRAM POSITIVE RODS RARE GRAM NEGATIVE RODS  Culture   Final    RARE Normal respiratory flora-no Staph aureus or Pseudomonas seen Performed at [facility], [address] [place], [place] [address]    Report Status 05/10/2020 FINAL  Final     Labs: Basic Metabolic Panel: Recent Labs  Lab 05/07/20 0237 05/08/20 0247 05/08/20 1532 05/09/20 0236 05/09/20 1007 05/10/20 0356  NA 137 133*  --  133*  --  137  K 3.8 3.7  --  3.7  --  3.7  CL 103 102  --  101  --  101  CO2  --  23  --  22  --  24  GLUCOSE 105* 87  --  86  --  88  BUN 13 12  --  9  --  9  CREATININE 0.80 0.85  --  0.81  --  0.83  CALCIUM  --  8.7*  --  9.1  --  9.6  MG  --   --  2.0  --  1.9  --    Liver Function Tests: No results for input(s): AST, ALT, ALKPHOS, BILITOT, PROT, ALBUMIN in the last 168 hours. No results for input(s): LIPASE, AMYLASE in the last 168 hours. No results for input(s): AMMONIA in the last 168 hours. CBC: Recent Labs  Lab 05/07/20 0211 05/07/20 0237 05/08/20 0247 05/09/20 0236 05/10/20 0356  WBC 11.0*  --  13.5* 10.3 10.7*  NEUTROABS 9.2*  --   --   --   --   HGB 12.3* 12.6* 11.7* 12.2* 13.0  HCT 36.9* 37.0* 35.3* 36.3* 39.6  MCV 91.3  --  91.0 90.1 90.8  PLT 203  --  183 185 217   Cardiac Enzymes: No results for input(s): CKTOTAL, CKMB, CKMBINDEX, TROPONINI in the last 168 hours. BNP: BNP (last 3 results) Recent Labs    05/07/20 0223 05/10/20 0356  BNP 1,095.8* 1,224.1*        Signed:  Meredeth Ide MD.  [facility] 05/11/2020, 1:48 PM

## 2020-05-11 NOTE — Discharge Instructions (Signed)
How to Use a Nebulizer, Adult  A nebulizer is a device that turns liquid medicine into a mist or vapor that you can breathe in (inhale). This medicine helps to open the air passages in your lungs. You may need to use a nebulizer if you have an acute breathing illness, such as pneumonia. A nebulizer may also be used to treat chronic conditions, such as asthma or chronic obstructive pulmonarydisease (COPD). There are different kinds of nebulizers. With some nebulizers, you breathe in medicine through a mouthpiece. With others, you get medicine through a mask that fits over your nose and mouth. What are the risks? If you use a nebulizer that does not fit right or is not cleaned properly, it can cause some problems, including:  Infection.  Eye irritation.  Delivery of too much medicine or not enough medicine.  Mouth irritation. Supplies needed:  Air compressor (nebulizer machine).  Nebulizer medicine cup (reservoir)and tubing.  Mouthpiece or face mask.  Soap and water.  Sterile or distilled water.  Clean towel. How to use a nebulizer Preparing a nebulizer Take these steps before using your nebulizer: 1. Read the manufacturer's instructions for your nebulizer, as machines vary. 2. Check your medicine. Make sure it has not expired and is not damaged in any way. 3. Wash your hands with soap and water. 4. Put all of the parts of your nebulizer on a sturdy, flat surface. 5. Connect the tubing to the nebulizer machine and to the reservoir. 6. Measure the liquid medicine according to instructions from your health care provider. Pour the liquid into the reservoir. 7. Attach the mouthpiece or mask. 8. Test the nebulizer by turning it on to make sure that a spray comes out. Then, turn it off. Using a nebulizer Be sure to stop the machine at any time if you start coughing or if the medicine foams or bubbles. 1. Sit in an upright, relaxed position. 2. If your nebulizer has a mask, put it  over your nose and mouth. It should fit somewhat snugly, with no gaps around the nose or cheeks where medicine could escape. If you use a mouthpiece, put it in your mouth. Press your lips firmly around the mouthpiece. 3. Turn on the nebulizer. 4. Some nebulizers have a finger valve. If yours does, cover up the air hole so the air gets to the nebulizer. 5. Once the medicine begins to mist out, take slow, deep breaths. If there is a finger valve, release it at the end of your breath. 6. Continue taking slow, deep breaths until the medicine in the nebulizer is gone and no mist appears.      Cleaning a nebulizer The nebulizer and all of its parts must be kept very clean. If the nebulizer and its parts are not cleaned properly, bacteria can grow inside of them. If you inhale the bacteria, you can get sick. Follow the manufacturer's instructions for cleaning your nebulizer. For most nebulizers, you should follow these guidelines:  Clean the mouthpiece or mask and the reservoir by: ? Rinsing them after each use. Use sterile or distilled water. ? Washing them 1-2 times a week using soap and warm water.  Do not wash the tubing.  After you rinse or wash them, place the parts on a clean towel and let them air-dry completely. After they dry, reconnect the pieces and turn the nebulizer on without any medicine in it. Doing this will blow air through the equipment to help dry it out.  Store the   nebulizer in a clean and dust-free place.  Check the filter at least one time every week. Replace the filter if it looks dirty. Follow these instructions at home  Use your nebulizer only as told by your health care provider. Do not use the nebulizer more than directed by your health care provider.  Do not use any products that contain nicotine or tobacco, such as cigarettes, e-cigarettes, and chewing tobacco. If you need help quitting, ask your health care provider.  Keep all follow-up visits as told by your  health care provider. This is important. Where to find more information  [employer]: allergyasthmanetwork.org  American [employer] Association: www.lung.org Contact a health care provider if:  You have trouble using the nebulizer.  Your nebulizer foams or stops working.  Your nebulizer does not create a mist after you add medicine and turn it on. Get help right away if:  You continue to have trouble breathing.  Your breathing gets worse during a nebulizer treatment. These symptoms may represent a serious problem that is an emergency. Do not wait to see if the symptoms will go away. Get medical help right away. Call your local emergency services (911 in the [place] Do not drive yourself to the hospital. Summary  A nebulizer is a device that turns liquid medicine into a mist (vapor) that you can breathe in (inhale).  Measure the liquid medicine according to instructions from your health care provider. Pour the liquid into the part of the nebulizer that holds the medicine (reservoir).  Once the medicine begins to mist out, take slow, deep breaths.  Rinse or wash the mouthpiece or mask and the reservoir after each use, and allow them to air-dry completely. This information is not intended to replace advice given to you by your health care provider. Make sure you discuss any questions you have with your health care provider. Document Revised: 09/13/2019 Document Reviewed: 02/10/2019 [employer] Patient Education  2021 [employer]  

## 2020-05-12 LAB — CULTURE, BLOOD (ROUTINE X 2)
Culture: NO GROWTH
Culture: NO GROWTH
Special Requests: ADEQUATE
Special Requests: ADEQUATE

## 2020-05-19 ENCOUNTER — Other Ambulatory Visit (HOSPITAL_COMMUNITY): Payer: Self-pay

## 2022-03-24 IMAGING — CT CT ANGIO CHEST
2 of 6 series · 18 of 36 positions shown · IV contrast (omnipaque)
Comparison: Chest radiograph 05/07/2020

CLINICAL DATA: Chest pain and shortness of breath.

EXAM:
CT ANGIOGRAPHY CHEST WITH CONTRAST
TECHNIQUE: Multidetector CT imaging of the chest was performed using the
standard protocol during bolus administration of intravenous
contrast. Multiplanar CT image reconstructions and MIPs were
obtained to evaluate the vascular anatomy.
CONTRAST:  80mL OMNIPAQUE IOHEXOL 350 MG/ML SOLN

[Series 7: pe thins · axial · 0.98mm/px · z∈[+44,+313]mm · 17 of 429 slices shown]
[im 22/429  lung]
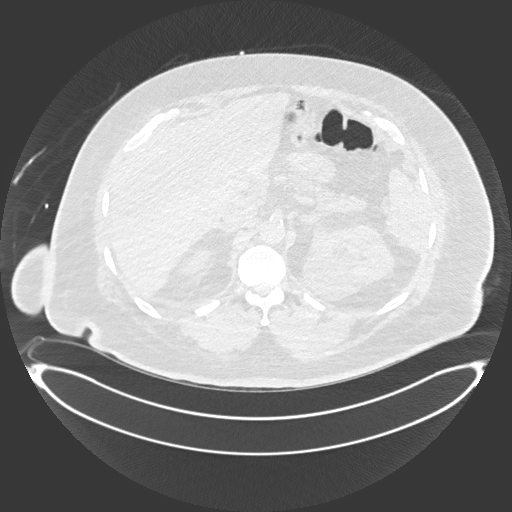
[im 43/429  mediastinal]
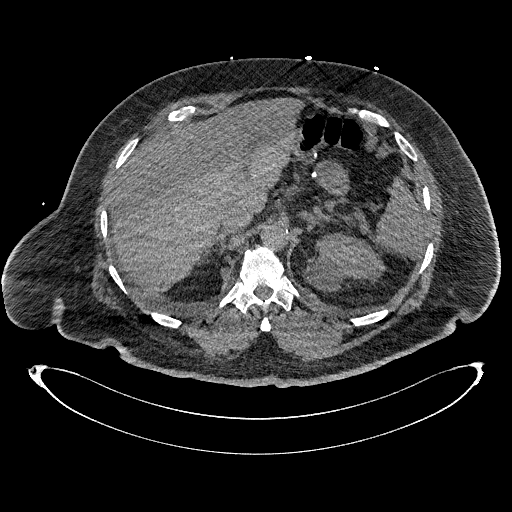
[im 65/429  lung]
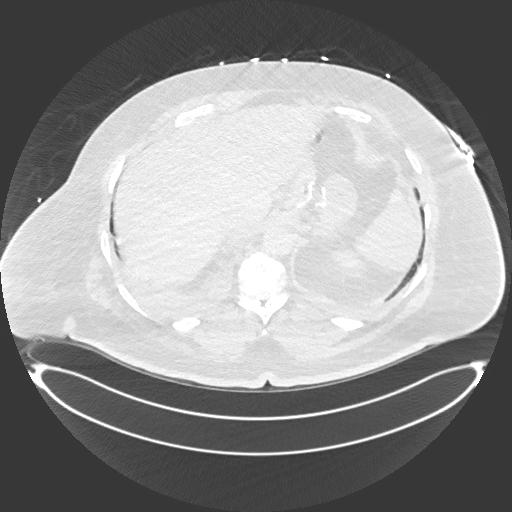
[im 86/429  mediastinal]
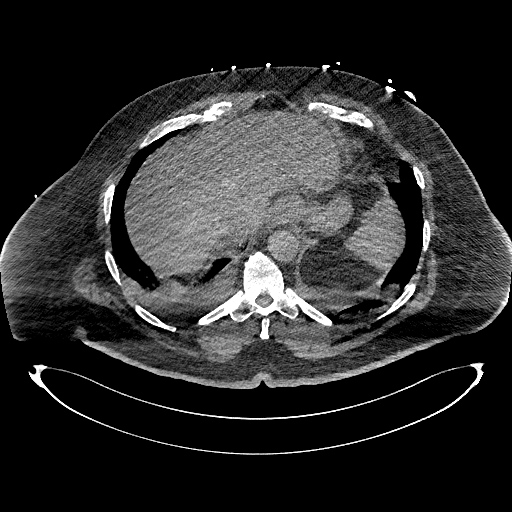
[im 129/429  lung]
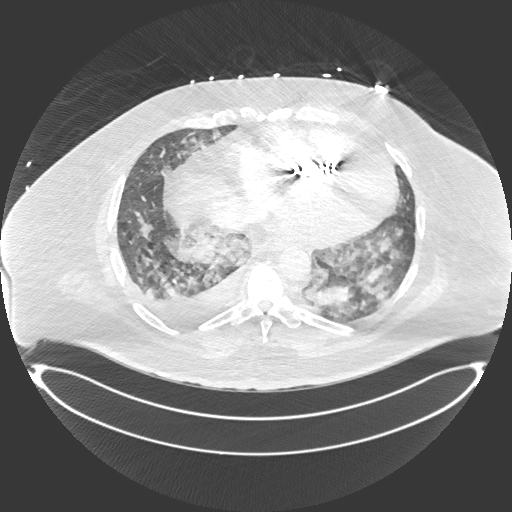
[im 150/429  mediastinal]
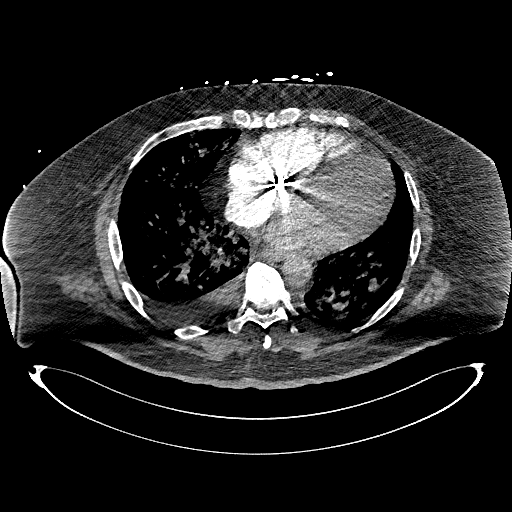
[im 172/429  lung]
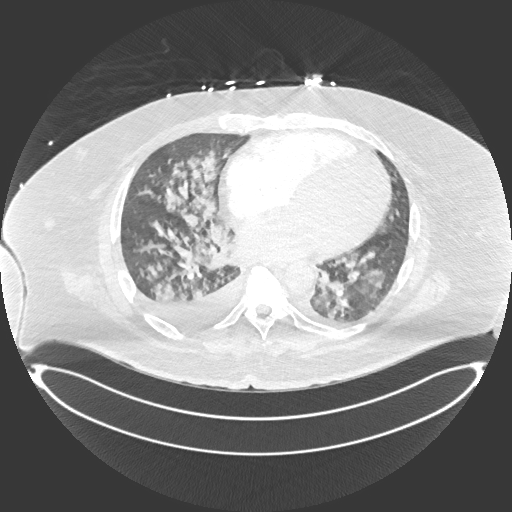
[im 193/429  mediastinal]
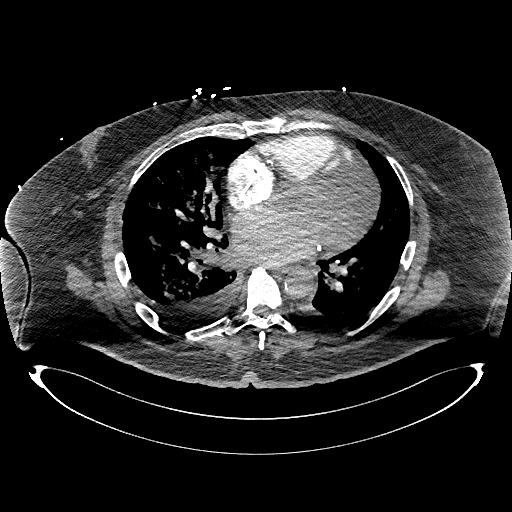
[im 215/429  lung]
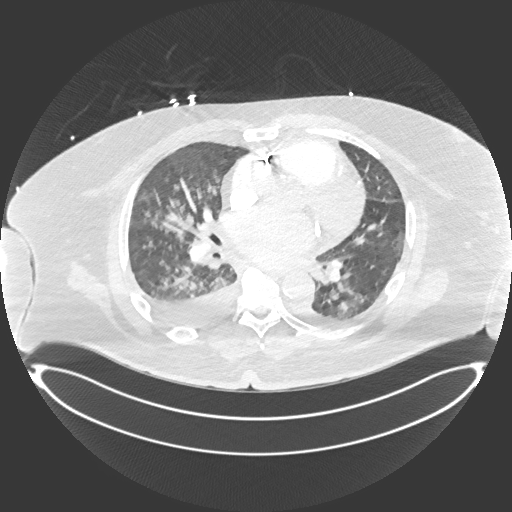
[im 236/429  mediastinal]
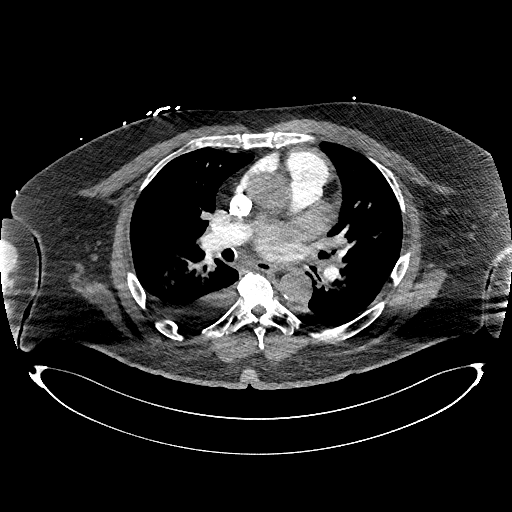
[im 257/429  lung]
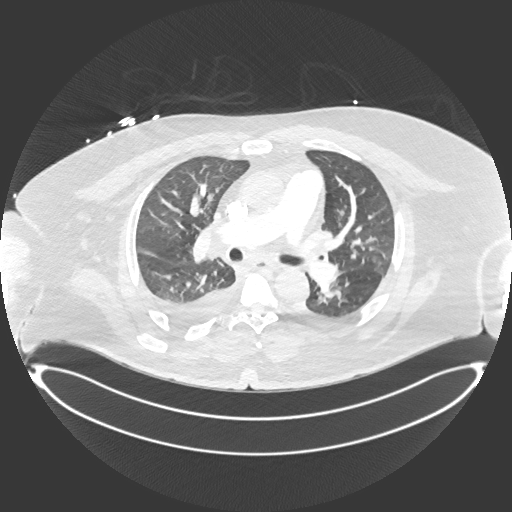
[im 279/429  mediastinal]
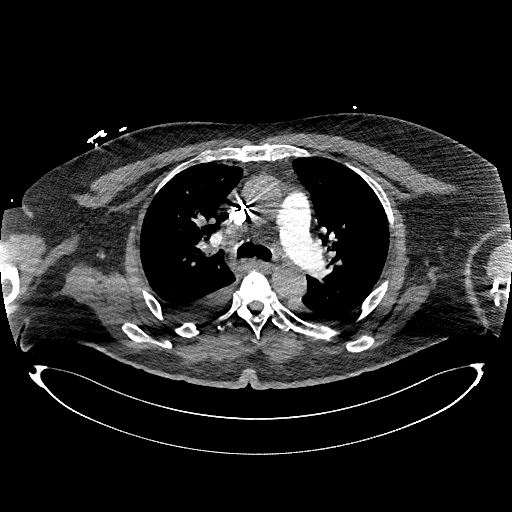
[im 300/429  lung]
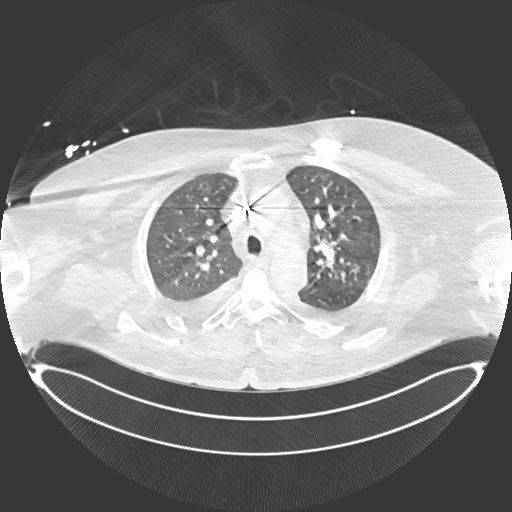
[im 343/429  mediastinal]
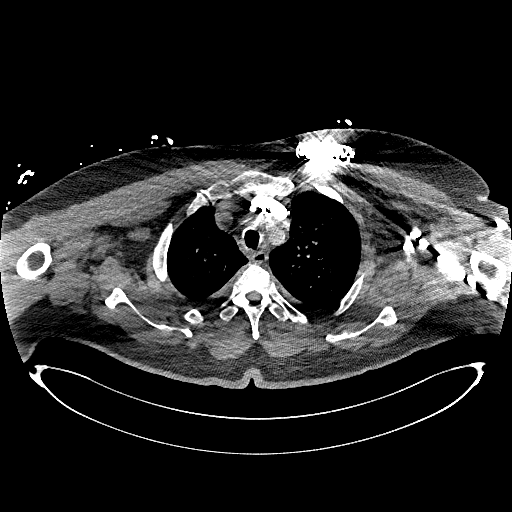
[im 364/429  lung]
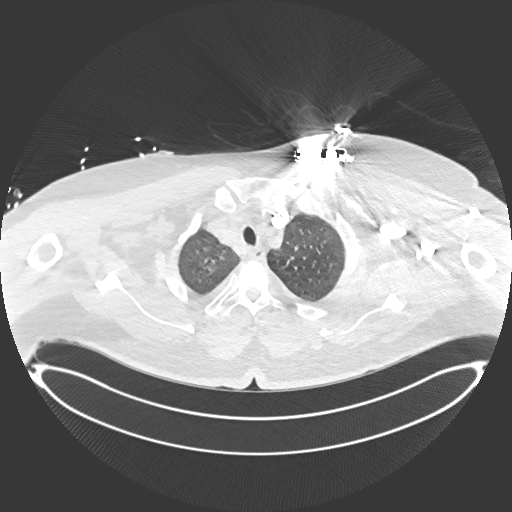
[im 386/429  mediastinal]
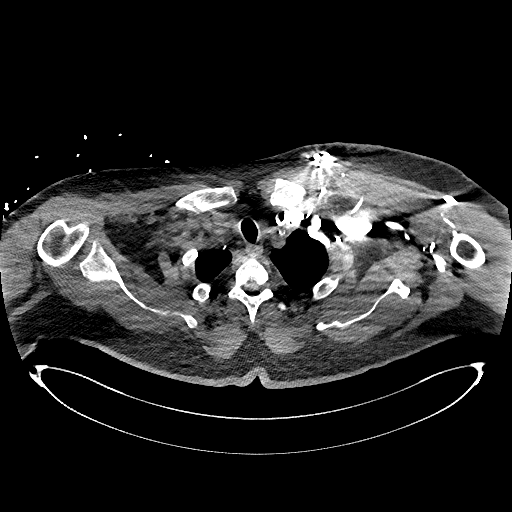
[im 407/429  lung]
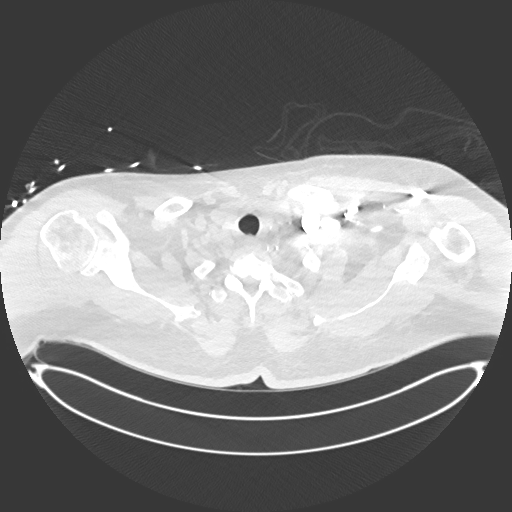

[Series 8: pe 2mm cor · coronal · 0.59mm/px · 1 of 144 slices shown]
[im 72/144  mediastinal]
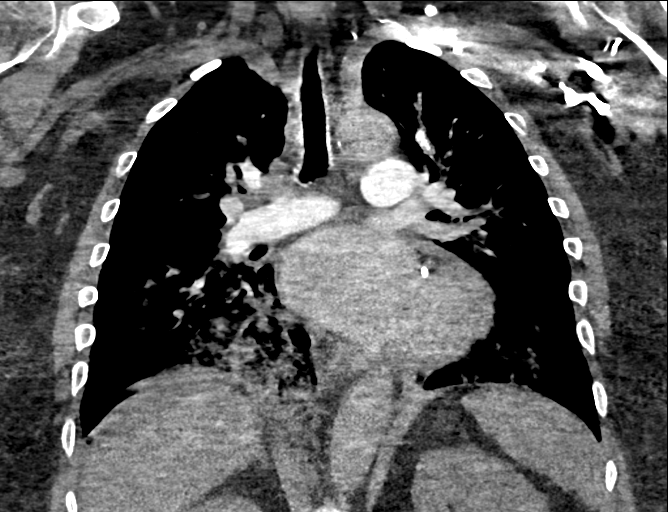

[18 of 36 positions shown; findings below may reference images not displayed]

FINDINGS: Cardiovascular: Examination is limited by streak artifact and
motion. There is moderately good opacification of the central and
proximal segmental pulmonary arteries. No focal filling defects
demonstrated. No evidence of significant pulmonary embolus. Cardiac
enlargement. No pericardial effusions. Coronary artery and aortic
calcification. No aortic aneurysm. Cardiac pacemaker.

Mediastinum/Nodes: Esophagus is decompressed. No significant
lymphadenopathy in the chest.

Lungs/Pleura: Motion artifact limits examination. There are small
bilateral pleural effusions. Nodular perihilar infiltrates in both
lungs, greater on the right. Changes likely represent multifocal
bronchopneumonia. Edema would be less likely to have this
appearance. No pneumothorax.

Upper Abdomen: No acute abnormalities demonstrated in the visualized
upper abdomen. Postoperative changes consistent with gastric bypass.
Cyst in the upper pole of the left kidney.

Musculoskeletal: Degenerative changes in the spine.

Review of the MIP images confirms the above findings.
IMPRESSION: 1. No evidence of significant pulmonary embolus.
2. Cardiac enlargement.
3. Nodular perihilar infiltrates in both lungs, greater on the
right, likely representing multifocal bronchopneumonia. Edema would
be less likely to have this appearance.
4. Small bilateral pleural effusions.
5. Aortic atherosclerosis.
6. Postoperative changes consistent with gastric bypass.

Aortic Atherosclerosis ([id number]).

## 2022-03-26 IMAGING — DX DG CHEST 1V PORT
1 series · 1 of 1 positions shown · non-contrast
Comparison: CT 05/07/2020.  Chest x-ray 05/07/2020.

CLINICAL DATA: Shortness of breath.

EXAM:
PORTABLE CHEST 1 VIEW

[chest]
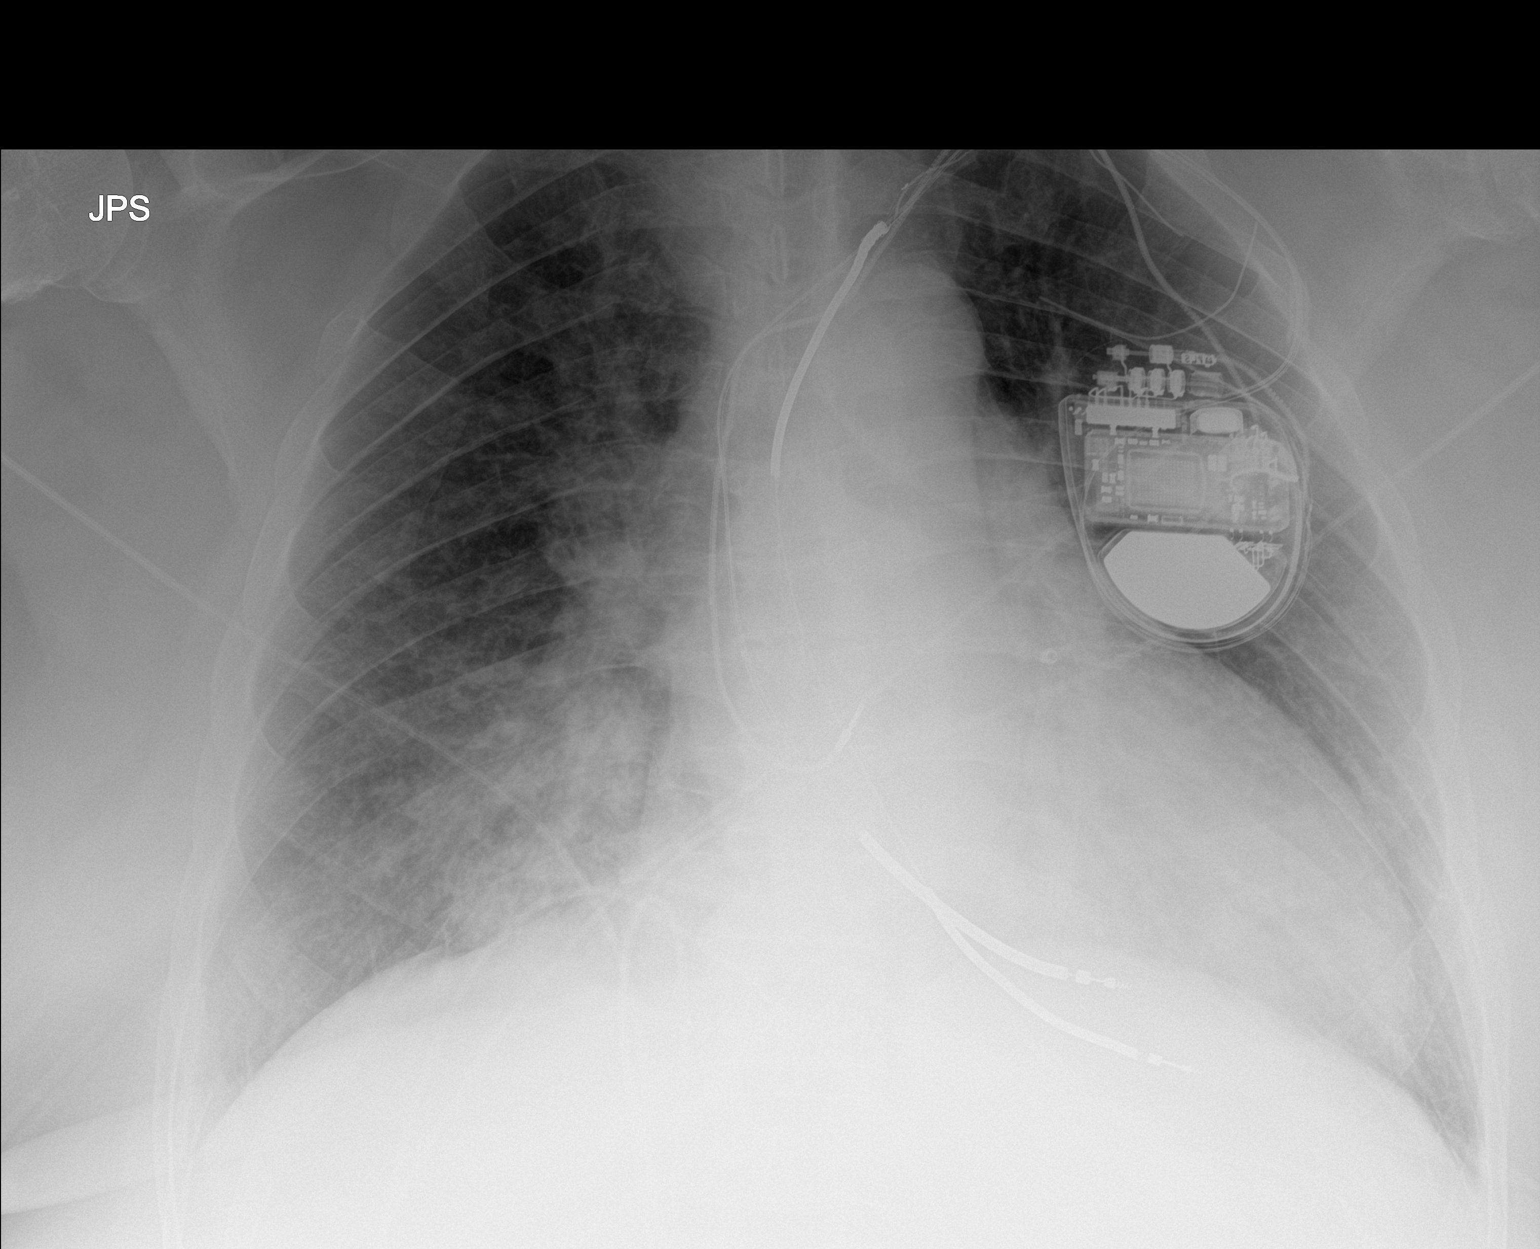

[1 of 1 positions shown; findings below may reference images not displayed]

FINDINGS: Cardiac pacer in stable position. Cardiomegaly with mild pulmonary
venous congestion. Persistent bilateral pulmonary infiltrates/edema
with progression in the right mid lung. No pleural effusion or
pneumothorax.
IMPRESSION: 1. Cardiac pacer stable position. Cardiomegaly with mild pulmonary
venous congestion.

2. Persistent bilateral pulmonary infiltrates/edema with progression
in the right mid lung. These findings are best demonstrated by prior
CT.
